# Patient Record
Sex: Male | Born: 1950 | Race: White | Hispanic: No | Marital: Married | Smoking: Never smoker
Health system: Southern US, Community
[De-identification: ages and names within clinical notes are randomized; demographics above are authoritative.]

## PROBLEM LIST (undated history)

## (undated) DIAGNOSIS — Z7289 Other problems related to lifestyle: Secondary | ICD-10-CM

## (undated) DIAGNOSIS — I480 Paroxysmal atrial fibrillation: Secondary | ICD-10-CM

## (undated) DIAGNOSIS — E785 Hyperlipidemia, unspecified: Secondary | ICD-10-CM

## (undated) DIAGNOSIS — I714 Abdominal aortic aneurysm, without rupture, unspecified: Secondary | ICD-10-CM

## (undated) DIAGNOSIS — F1090 Alcohol use, unspecified, uncomplicated: Secondary | ICD-10-CM

## (undated) DIAGNOSIS — R7303 Prediabetes: Secondary | ICD-10-CM

## (undated) DIAGNOSIS — I4510 Unspecified right bundle-branch block: Secondary | ICD-10-CM

## (undated) DIAGNOSIS — I10 Essential (primary) hypertension: Secondary | ICD-10-CM

## (undated) DIAGNOSIS — M1990 Unspecified osteoarthritis, unspecified site: Secondary | ICD-10-CM

## (undated) DIAGNOSIS — E119 Type 2 diabetes mellitus without complications: Secondary | ICD-10-CM

## (undated) HISTORY — DX: Other problems related to lifestyle: Z72.89

## (undated) HISTORY — DX: Alcohol use, unspecified, uncomplicated: F10.90

## (undated) HISTORY — PX: TONSILLECTOMY: SUR1361

## (undated) HISTORY — DX: Essential (primary) hypertension: I10

## (undated) HISTORY — DX: Prediabetes: R73.03

## (undated) HISTORY — DX: Hyperlipidemia, unspecified: E78.5

## (undated) HISTORY — DX: Unspecified right bundle-branch block: I45.10

## (undated) HISTORY — DX: Paroxysmal atrial fibrillation: I48.0

## (undated) SURGERY — Surgical Case
Anesthesia: *Unknown

---

## 2018-07-12 ENCOUNTER — Emergency Department (HOSPITAL_COMMUNITY): Payer: Medicare Other

## 2018-07-12 ENCOUNTER — Other Ambulatory Visit: Payer: Self-pay

## 2018-07-12 ENCOUNTER — Encounter (HOSPITAL_COMMUNITY): Payer: Self-pay

## 2018-07-12 ENCOUNTER — Emergency Department (HOSPITAL_COMMUNITY)
Admission: EM | Admit: 2018-07-12 | Discharge: 2018-07-12 | Disposition: A | Discharge status: Home / Self Care | Payer: Medicare Other | Attending: Emergency Medicine | Admitting: Emergency Medicine

## 2018-07-12 DIAGNOSIS — I4891 Unspecified atrial fibrillation: Secondary | ICD-10-CM | POA: Diagnosis not present

## 2018-07-12 DIAGNOSIS — Z79899 Other long term (current) drug therapy: Secondary | ICD-10-CM | POA: Diagnosis not present

## 2018-07-12 DIAGNOSIS — I10 Essential (primary) hypertension: Secondary | ICD-10-CM | POA: Diagnosis not present

## 2018-07-12 DIAGNOSIS — E119 Type 2 diabetes mellitus without complications: Secondary | ICD-10-CM | POA: Diagnosis not present

## 2018-07-12 DIAGNOSIS — Z7901 Long term (current) use of anticoagulants: Secondary | ICD-10-CM | POA: Insufficient documentation

## 2018-07-12 DIAGNOSIS — R5383 Other fatigue: Secondary | ICD-10-CM | POA: Diagnosis present

## 2018-07-12 HISTORY — DX: Abdominal aortic aneurysm, without rupture: I71.4

## 2018-07-12 HISTORY — DX: Unspecified osteoarthritis, unspecified site: M19.90

## 2018-07-12 HISTORY — DX: Type 2 diabetes mellitus without complications: E11.9

## 2018-07-12 HISTORY — DX: Abdominal aortic aneurysm, without rupture, unspecified: I71.40

## 2018-07-12 LAB — COMPREHENSIVE METABOLIC PANEL
ALT: 41 U/L (ref 0–44)
AST: 35 U/L (ref 15–41)
Albumin: 4.2 g/dL (ref 3.5–5.0)
Alkaline Phosphatase: 41 U/L (ref 38–126)
Anion gap: 13 (ref 5–15)
BUN: 12 mg/dL (ref 8–23)
CO2: 20 mmol/L — ABNORMAL LOW (ref 22–32)
CREATININE: 1 mg/dL (ref 0.61–1.24)
Calcium: 9.8 mg/dL (ref 8.9–10.3)
Chloride: 105 mmol/L (ref 98–111)
GFR calc Af Amer: >60 mL/min (ref >60)
GFR calc non Af Amer: >60 mL/min (ref >60)
Glucose, Bld: 110 mg/dL — ABNORMAL HIGH (ref 70–99)
Potassium: 4.2 mmol/L (ref 3.5–5.1)
Sodium: 138 mmol/L (ref 135–145)
Total Bilirubin: 0.9 mg/dL (ref 0.3–1.2)
Total Protein: 7.6 g/dL (ref 6.5–8.1)

## 2018-07-12 LAB — CBC WITH DIFFERENTIAL/PLATELET
Abs Immature Granulocytes: 0.02 10*3/uL (ref 0.00–0.07)
Basophils Absolute: 0 10*3/uL (ref 0.0–0.1)
Basophils Relative: 1 %
Eosinophils Absolute: 0.2 10*3/uL (ref 0.0–0.5)
Eosinophils Relative: 3 %
HCT: 45.7 % (ref 39.0–52.0)
Hemoglobin: 15.7 g/dL (ref 13.0–17.0)
Immature Granulocytes: 0 %
Lymphocytes Relative: 25 %
Lymphs Abs: 1.7 10*3/uL (ref 0.7–4.0)
MCH: 32.4 pg (ref 26.0–34.0)
MCHC: 34.4 g/dL (ref 30.0–36.0)
MCV: 94.2 fL (ref 80.0–100.0)
MONOS PCT: 9 %
Monocytes Absolute: 0.6 10*3/uL (ref 0.1–1.0)
Neutro Abs: 4.2 10*3/uL (ref 1.7–7.7)
Neutrophils Relative %: 62 %
Platelets: 177 10*3/uL (ref 150–400)
RBC: 4.85 MIL/uL (ref 4.22–5.81)
RDW: 11.9 % (ref 11.5–15.5)
WBC: 6.7 10*3/uL (ref 4.0–10.5)
nRBC: 0 % (ref 0.0–0.2)

## 2018-07-12 LAB — TROPONIN I
Troponin I: <0.03 ng/mL (ref <0.03)
Troponin I: <0.03 ng/mL (ref <0.03)

## 2018-07-12 LAB — LIPASE, BLOOD: Lipase: 50 U/L (ref 11–51)

## 2018-07-12 LAB — MAGNESIUM: Magnesium: 2.1 mg/dL (ref 1.7–2.4)

## 2018-07-12 MED ORDER — DILTIAZEM HCL 25 MG/5ML IV SOLN
10.0000 mg | Freq: Once | INTRAVENOUS | Status: AC
  Administered 2018-07-12: 10 mg via INTRAVENOUS
  Filled 2018-07-12: qty 5

## 2018-07-12 MED ORDER — DILTIAZEM HCL 30 MG PO TABS
30.0000 mg | ORAL_TABLET | Freq: Once | ORAL | Status: AC
  Administered 2018-07-12: 30 mg via ORAL
  Filled 2018-07-12 (×2): qty 1

## 2018-07-12 MED ORDER — RIVAROXABAN 20 MG PO TABS: 20.0000 mg | ORAL_TABLET | Freq: Every day | ORAL | 0 refills | Status: DC

## 2018-07-12 MED ORDER — METOPROLOL SUCCINATE ER 25 MG PO TB24: 25.0000 mg | ORAL_TABLET | Freq: Every day | ORAL | 0 refills | Status: DC

## 2018-07-12 MED ORDER — RIVAROXABAN (XARELTO) EDUCATION KIT FOR AFIB PATIENTS
PACK | Freq: Once | Status: AC
  Administered 2018-07-12: 23:00:00
  Filled 2018-07-12: qty 1

## 2018-07-12 MED ORDER — RIVAROXABAN 20 MG PO TABS
20.0000 mg | ORAL_TABLET | Freq: Every day | ORAL | Status: DC
  Administered 2018-07-12: 20 mg via ORAL
  Filled 2018-07-12: qty 1

## 2018-07-12 NOTE — Discharge Instructions (Signed)
Please do not take any ibuprofen while you are taking Xarelto.  The cardiologist office should call you tomorrow morning to set up an appointment.  Please stop taking your amlodipine.  If you develop any new pain, that pain in your back returns, or you have any additional concerns please return to the emergency room.  Please do not take any more sildenafil (Revatio) until you check with the cardiologist if it is safe.

## 2018-07-12 NOTE — ED Triage Notes (Signed)
Pt arrives in POV from home c/o fatigue and new onset of afib. Pt stated he went to urgent care today because he wasn't feeling well and was found to be in afib; pt has hx of HTN; denies pain. A&o x4. VSS at this time.

## 2018-07-12 NOTE — ED Provider Notes (Signed)
[facility] EMERGENCY DEPARTMENT Provider Note   CSN: [id number] Arrival date & time: 07/12/18  1705     History   Chief Complaint Chief Complaint  Patient presents with  . Atrial Fibrillation    HPI Luis Adams is a 68 y.o. male with a past medical history of arthritis, DM 2, hypertension, who presents today for evaluation of generally feeling weak for 6 days.  He reports that he has been feeling much worse over the past 2 days with general fatigue.  He also reports worsening pain in his back between his shoulder blades.  He usually has pain in this area and this is not a new pain just worse than usual.  No fevers.  No shortness of breath.  No nausea vomiting or diarrhea.  He initially went to an urgent care after his blood pressure was high when he checked it at the drugstore and an EKG, saw he was in A. fib and sent him here.  He does not have any history of A. fib.  Does not take any anticoagulants.  He denies any recent excess caffeine, stimulant, or Sudafed use.  HPI  Past Medical History:  Diagnosis Date  . AAA (abdominal aortic aneurysm) ([place])   . Arthritis   . Diabetes mellitus without complication ([place])   . Hypertension     There are no active problems to display for this patient.   Past Surgical History:  Procedure Laterality Date  . TONSILLECTOMY          Home Medications    Prior to Admission medications   Medication Sig Start Date End Date Taking? Authorizing Provider  ibuprofen (ADVIL,MOTRIN) 600 MG tablet Take 600 mg by mouth daily as needed (back pain).   Yes [provider]  quinapril (ACCUPRIL) 40 MG tablet Take 40 mg by mouth daily. 06/18/18  Yes [provider]  sildenafil (REVATIO) 20 MG tablet Take 100 mg by mouth daily as needed (erectile dysfunction).  04/17/18  Yes [provider]  Tetrahydrozoline HCl (VISINE OP) Apply 1 drop to eye daily as needed (irritation/ dry eyes).   Yes [provider]  metoprolol succinate (TOPROL-XL) 25 MG 24 hr tablet Take 1 tablet (25 mg total) by mouth daily for 30 days. 07/12/18 08/11/18  Lorin Glass, PA-C  rivaroxaban (XARELTO) 20 MG TABS tablet Take 1 tablet (20 mg total) by mouth daily with supper. 07/12/18   Lorin Glass, PA-C  Norvasc '5mg'$   Quinapril '40mg'$   Family History Family History  Adopted: Yes  Problem Relation Age of Onset  . Cancer Maternal Grandmother     Social History Social History   Tobacco Use  . Smoking status: Never Smoker  . Smokeless tobacco: Never Used  Substance Use Topics  . Alcohol use: Yes    Alcohol/week: 6.0 standard drinks    Types: 6 Cans of beer per week    Comment: reports drinking 1 can of beer daily  . Drug use: Never     Allergies   Patient has no known allergies.   Review of Systems Review of Systems  Constitutional: Positive for fatigue. Negative for chills and fever.  HENT: Negative for congestion.   Respiratory: Negative for chest tightness and shortness of breath.   Cardiovascular: Positive for palpitations. Negative for chest pain and leg swelling.  Gastrointestinal: Negative for abdominal pain, diarrhea, nausea and vomiting.  Musculoskeletal: Positive for back pain.  Neurological: Negative for weakness and headaches.  All other systems reviewed  and are negative.    Physical Exam Updated Vital Signs BP 138/86   Pulse 63   Temp 98.5 F (36.9 C) (Oral)   Resp 18   Ht 6' (1.829 m)   Wt 90.7 kg   SpO2 100%   BMI 27.12 kg/m   Physical Exam Vitals signs and nursing note reviewed.  Constitutional:      Appearance: He is well-developed.  HENT:     Head: Normocephalic and atraumatic.     Nose: Nose normal.  Eyes:     Conjunctiva/sclera: Conjunctivae normal.  Neck:     Musculoskeletal: Full passive range of motion without pain and neck supple. No neck rigidity.     Vascular: No JVD.     Trachea: Trachea normal.  Cardiovascular:     Rate and  Rhythm: Tachycardia present. Rhythm irregularly irregular.     Pulses: Normal pulses.          Radial pulses are 2+ on the right side and 2+ on the left side.       Popliteal pulses are 2+ on the right side and 2+ on the left side.       Dorsalis pedis pulses are 2+ on the right side and 2+ on the left side.     Heart sounds: No murmur.  Pulmonary:     Effort: Pulmonary effort is normal. No respiratory distress.     Breath sounds: Normal breath sounds and air entry. No decreased breath sounds.  Abdominal:     Palpations: Abdomen is soft.     Tenderness: There is no abdominal tenderness.  Musculoskeletal:     Right lower leg: No edema.     Left lower leg: No edema.  Skin:    General: Skin is warm and dry.  Neurological:     Mental Status: He is alert.      ED Treatments / Results  Labs (all labs ordered are listed, but only abnormal results are displayed) Labs Reviewed  COMPREHENSIVE METABOLIC PANEL - Abnormal; Notable for the following components:      Result Value   CO2 20 (*)    Glucose, Bld 110 (*)    All other components within normal limits  LIPASE, BLOOD  CBC WITH DIFFERENTIAL/PLATELET  TROPONIN I  MAGNESIUM  TROPONIN I    EKG EKG Interpretation  Date/Time:  Sunday July 12 2018 17:17:20 EST Ventricular Rate:  100 PR Interval:    QRS Duration: 140 QT Interval:  364 QTC Calculation: 470 R Axis:   72 Text Interpretation:  Atrial fibrillation Right bundle branch block No old tracing to compare Confirmed by Dorie Rank [phone]) on 07/12/2018 5:38:46 PM   EKG Interpretation  Date/Time:  Sunday July 12 2018 20:25:27 EST Ventricular Rate:  84 PR Interval:    QRS Duration: 147 QT Interval:  400 QTC Calculation: 473 R Axis:   56 Text Interpretation:  Atrial fibrillation Right bundle branch block Since last tracing rate slower Confirmed by Dorie Rank [id number]) on 07/12/2018 8:27:44 PM        Radiology Dg Chest 2 View  Result Date: 07/12/2018 CLINICAL  DATA:  Fatigue, new onset atrial fibrillation, chest pain and back pain. EXAM: CHEST - 2 VIEW COMPARISON:  None. FINDINGS: Heart size and mediastinal contours are within normal limits. Lungs are clear. No pleural effusion or pneumothorax seen. Mild degenerative spurring within the thoracic spine. No acute or suspicious osseous finding. IMPRESSION: No active cardiopulmonary disease. No evidence of pneumonia or pulmonary edema. Electronically Signed  By: Franki Cabot M.D.   On: 07/12/2018 19:09    Procedures Procedures (including critical care time)  Medications Ordered in ED Medications  rivaroxaban (XARELTO) tablet 20 mg (20 mg Oral Given 07/12/18 2138)  diltiazem (CARDIZEM) tablet 30 mg (30 mg Oral Given 07/12/18 1858)  diltiazem (CARDIZEM) injection 10 mg (10 mg Intravenous Given 07/12/18 1949)  rivaroxaban [employer]) Education Kit for Afib patients ( Does not apply Given 07/12/18 2308)     Initial Impression / Assessment and Plan / ED Course  I have reviewed the triage vital signs and the nursing notes.  Pertinent labs & imaging results that were available during my care of the patient were reviewed by me and considered in my medical decision making (see chart for details).  Clinical Course as of Jul 12 2322  Nancy Fetter Jul 12, 2018  2019 Patient's heart rate is now down into the 70s.  He reports that the pain in his neck and back has gone away, around the time when his heart rate decreased.   [EH]  2109 With Dr. Radford Pax from cardiology.  She recommends stopping patient's amlodipine and starting him on Toprol XL are 25 mg in addition to starting with Xarelto 20 mg.  She states she has appointment in her office tomorrow and will see the patient then.   [EH]    Clinical Course User Index [EH] Lorin Glass, PA-C   Cha2DS-VASc score 3.   Ikey Omary presents today for evaluation of generally feeling weak with new onset A. fib found today at urgent care.  On initial examination he was  tachycardic with A. fib into the 120s.  He had tightness in between his shoulder blades posteriorly.  His symptoms have been going on for approximately 6 days and he is not anticoagulated, therefore not a candidate for cardioversion.  Labs were obtained and reviewed, he does not have any significant hematologic or electrolyte derangements.  Magnesium is normal.  Troponin negative x2.  X-ray without acute abnormalities.  He was given p.o. 30 mg Cardizem which did not slow his rate down, he was then given 10 mg of diltiazem IV which had adequate rate control slowing him into the 80s.  When his HR slowed his pain in his back and neck went away.    I spoke with Dr. Radford Pax from cardiology who recommended having patient stop his amlodipine, starting him on metoprolol XL 25 mg and Xarelto 20 mg.  He is given Xarelto education kit in addition to first dose of Xarelto while in the department.    Dr. Radford Pax reports she will be able to see the patient tomorrow in the office.    Patient was provided xarelto education by both my self and RN.    Return precautions were discussed with patient who states their understanding.  At the time of discharge patient denied any unaddressed complaints or concerns.  Patient is agreeable for discharge home.   Final Clinical Impressions(s) / ED Diagnoses   Final diagnoses:  Atrial fibrillation with RVR ([place])  New onset atrial fibrillation [facility])    ED Discharge Orders         Ordered    Amb referral to AFIB Clinic     07/12/18 1743    rivaroxaban (XARELTO) 20 MG TABS tablet  Daily with supper     07/12/18 2252    metoprolol succinate (TOPROL-XL) 25 MG 24 hr tablet  Daily     07/12/18 2252  Ollen Gross 07/12/18 2335    Dorie Rank, MD 07/15/18 1332

## 2018-07-13 ENCOUNTER — Ambulatory Visit (INDEPENDENT_AMBULATORY_CARE_PROVIDER_SITE_OTHER): Payer: Medicare Other | Admitting: Cardiology

## 2018-07-13 ENCOUNTER — Encounter: Payer: Self-pay | Admitting: Cardiology

## 2018-07-13 VITALS — BP 141/90 | HR 71 | Ht 72.0 in | Wt 198.2 lb

## 2018-07-13 DIAGNOSIS — E119 Type 2 diabetes mellitus without complications: Secondary | ICD-10-CM | POA: Insufficient documentation

## 2018-07-13 DIAGNOSIS — I4510 Unspecified right bundle-branch block: Secondary | ICD-10-CM

## 2018-07-13 DIAGNOSIS — I10 Essential (primary) hypertension: Secondary | ICD-10-CM

## 2018-07-13 DIAGNOSIS — E1100 Type 2 diabetes mellitus with hyperosmolarity without nonketotic hyperglycemic-hyperosmolar coma (NKHHC): Secondary | ICD-10-CM

## 2018-07-13 DIAGNOSIS — I480 Paroxysmal atrial fibrillation: Secondary | ICD-10-CM

## 2018-07-13 DIAGNOSIS — I4891 Unspecified atrial fibrillation: Secondary | ICD-10-CM | POA: Diagnosis not present

## 2018-07-13 HISTORY — DX: Essential (primary) hypertension: I10

## 2018-07-13 HISTORY — DX: Paroxysmal atrial fibrillation: I48.0

## 2018-07-13 MED ORDER — RIVAROXABAN 20 MG PO TABS: 20.0000 mg | ORAL_TABLET | Freq: Every day | ORAL | 11 refills | Status: DC

## 2018-07-13 MED ORDER — RIVAROXABAN 20 MG PO TABS: 20.0000 mg | ORAL_TABLET | Freq: Every day | ORAL | 0 refills | Status: DC

## 2018-07-13 NOTE — Progress Notes (Signed)
Cardiology Office Note    Date:  07/13/2018   ID:  Luis BeginDaniel Ibrahim, DOB 20-Feb-1951, MRN [id number]  PCP:  Kathlee NationsEason, Paul, MD  Cardiologist:  Armanda Magicraci Mirissa Lopresti, MD   Chief Complaint  Patient presents with  . Atrial Fibrillation    History of Present Illness:  Luis Adams is a 68 y.o. male who is being seen today for the evaluation of Atrial fibrillation at the request of Kathlee NationsEason, Paul, MD.  This is a very pleasant 68yo male with a history of AAA, DM and HTN.  He says that over the past week he has been feeling weak and then over the past 2 days he has been very fatigued.  He also noticed pain between his shoulder blades which is a chronic thing for him but worse recently.  He denies any CP, SOB, nausea or diaphoresis.  He went to Urgent Care because he checked his BP at the drug store and it was high.  At the Urgent care he was found to be in afib and sent to the ER.  He was noted to be in afib with RVR in the 120's.  Trop was negative x 2.  He was given Cardizem 30mg  PO and then 10mg  IV with HRs down into the 80;s.  He was started on Xarelto 20mg  daily and Toprol XL 25mg  daily and is not referred for further evaluation.    This am he tells me he feels much better and thinks he is back in NSR.  He also says that the shoulder blade discomfort is actually over his Trapezius muscle and gets tight whenever he gets stressed.  He drinks 3 cups of caffeinated coffee daily and 5 beers daily.    Past Medical History:  Diagnosis Date  . AAA (abdominal aortic aneurysm) ([facility])   . Arthritis   . Benign essential HTN 07/13/2018  . Diabetes mellitus without complication ([facility])   . PAF (paroxysmal atrial fibrillation) ([facility]) 07/13/2018    Past Surgical History:  Procedure Laterality Date  . TONSILLECTOMY      Current Medications: Current Meds  Medication Sig  . ibuprofen (ADVIL,MOTRIN) 600 MG tablet Take 600 mg by mouth daily as needed (back pain).  . metoprolol succinate (TOPROL-XL) 25 MG 24 hr tablet  Take 1 tablet (25 mg total) by mouth daily for 30 days.  . quinapril (ACCUPRIL) 40 MG tablet Take 40 mg by mouth daily.  . rivaroxaban (XARELTO) 20 MG TABS tablet Take 1 tablet (20 mg total) by mouth daily with supper.  . sildenafil (REVATIO) 20 MG tablet Take 100 mg by mouth daily as needed (erectile dysfunction).   . Tetrahydrozoline HCl (VISINE OP) Apply 1 drop to eye daily as needed (irritation/ dry eyes).    Allergies:   Patient has no known allergies.   Social History   Socioeconomic History  . Marital status: Married    Spouse name: Not on file  . Number of children: Not on file  . Years of education: Not on file  . Highest education level: Not on file  Occupational History  . Not on file  Social Needs  . Financial resource strain: Not on file  . Food insecurity:    Worry: Not on file    Inability: Not on file  . Transportation needs:    Medical: Not on file    Non-medical: Not on file  Tobacco Use  . Smoking status: Never Smoker  . Smokeless tobacco: Never Used  Substance and Sexual Activity  .  Alcohol use: Yes    Alcohol/week: 6.0 standard drinks    Types: 6 Cans of beer per week    Comment: reports drinking 1 can of beer daily  . Drug use: Never  . Sexual activity: Not on file  Lifestyle  . Physical activity:    Days per week: Not on file    Minutes per session: Not on file  . Stress: Not on file  Relationships  . Social connections:    Talks on phone: Not on file    Gets together: Not on file    Attends religious service: Not on file    Active member of club or organization: Not on file    Attends meetings of clubs or organizations: Not on file    Relationship status: Not on file  Other Topics Concern  . Not on file  Social History Narrative  . Not on file     Family History:  The patient's family history includes Cancer in his maternal grandmother. He was adopted.   ROS:   Please see the history of present illness.    ROS All other systems  reviewed and are negative.  No flowsheet data found.     PHYSICAL EXAM:   VS:  BP (!) 141/90   Pulse 71   Ht 6' (1.829 m)   Wt 198 lb 3.2 oz (89.9 kg)   BMI 26.88 kg/m    GEN: Well nourished, well developed, in no acute distress  HEENT: normal  Neck: no JVD, carotid bruits, or masses Cardiac: irregularlyy irregular; no murmurs, rubs, or gallops,no edema.  Intact distal pulses bilaterally.  Respiratory:  clear to auscultation bilaterally, normal work of breathing GI: soft, nontender, nondistended, + BS MS: no deformity or atrophy  Skin: warm and dry, no rash Neuro:  Alert and Oriented x 3, Strength and sensation are intact Psych: euthymic mood, full affect  Wt Readings from Last 3 Encounters:  07/13/18 198 lb 3.2 oz (89.9 kg)  07/12/18 200 lb (90.7 kg)      Studies/Labs Reviewed:   EKG:  EKG is ordered today and showed NSR with RBBB  Recent Labs: 07/12/2018: ALT 41; BUN 12; Creatinine, Ser 1.00; Hemoglobin 15.7; Magnesium 2.1; Platelets 177; Potassium 4.2; Sodium 138   Lipid Panel No results found for: CHOL, TRIG, HDL, CHOLHDL, VLDL, LDLCALC, LDLDIRECT  Additional studies/ records that were reviewed today include:  Hospital notes and labs from 07/12/2018    ASSESSMENT:    1. New onset atrial fibrillation ([facility])   2. Benign essential HTN   3. Type 2 diabetes mellitus with hyperosmolarity without coma, without long-term current use of insulin ([facility])      PLAN:  In order of problems listed above:  1. New Onset atrial fibrillation - this has likely been going on at least a week based on symptoms.  He is now back in NSR and feels much better.   He will continue on Xarelto 20mg  daily but is concerned about affordability.  K+ 4.2 and Mag 2.1. I will check a 2D echo to assess LVF and LA size.  Continue BB.  We discussed triggers for PAF and I encouraged him to cut back on caffeine to 1 cup daily and to wean off ETOH all together.   2.  HTN - BP is borderline controlled  on exam today.  He will continue on Toprol XL 25mg  daily and Quinapril 40mg  daily.  I have encouraged him to follow a low sodium diet.   3.  Type 2 DM - this is followed by his PCP.    4.  RBBB/Shoulder blade pain - this is a chronic problem that increases when he is stressed and feels like his trapezius muscle is in spasm.  No acute ST changes on EKG and trop neg x 2.  This does not sound cardiac in nature but he has an abnormal EKG with RBBB.  Given CRFs including tobacco use in the past, HTN and pre DM will get a Lexiscan myoview to rule out ischemia.     Medication Adjustments/Labs and Tests Ordered: Current medicines are reviewed at length with the patient today.  Concerns regarding medicines are outlined above.  Medication changes, Labs and Tests ordered today are listed in the Patient Instructions below.  There are no Patient Instructions on file for this visit.   Signed, Armanda Magic, MD  07/13/2018 11:32 AM    [facility] [address] [place], [place], [place]  [address] Phone: [phone]; Fax: [phone]

## 2018-07-13 NOTE — Addendum Note (Signed)
Addended by: Dustin Flock on: 07/13/2018 01:05 PM   Modules accepted: Orders

## 2018-07-13 NOTE — Patient Instructions (Signed)
Medication Instructions:  Stop: Ibuprofen  If you need a refill on your cardiac medications before your next appointment, please call your pharmacy.   Lab work: None If you have labs (blood work) drawn today and your tests are completely normal, you will receive your results only by: Marland Kitchen MyChart Message (if you have MyChart) OR . A paper copy in the mail If you have any lab test that is abnormal or we need to change your treatment, we will call you to review the results.  Testing/Procedures: Your physician has requested that you have an echocardiogram. Echocardiography is a painless test that uses sound waves to create images of your heart. It provides your doctor with information about the size and shape of your heart and how well your heart's chambers and valves are working. This procedure takes approximately one hour. There are no restrictions for this procedure.  Your physician has requested that you have a lexiscan myoview. For further information please visit [email or web address] Please follow instruction sheet, as given.  Follow-Up: At [facility], you and your health needs are our priority.  As part of our continuing mission to provide you with exceptional heart care, we have created designated Provider Care Teams.  These Care Teams include your primary Cardiologist (physician) and Advanced Practice Providers (APPs -  Physician Assistants and Nurse Practitioners) who all work together to provide you with the care you need, when you need it. You will need a follow up appointment in 6 months.  Please call our office 2 months in advance to schedule this appointment.  You may see Dr. Mayford Knife or one of the following Advanced Practice Providers on your designated Care Team:   Edmund, PA-C Ronie Spies, PA-C . Jacolyn Reedy, PA-C

## 2018-07-20 ENCOUNTER — Telehealth (HOSPITAL_COMMUNITY): Payer: Self-pay | Admitting: *Deleted

## 2018-07-20 NOTE — Telephone Encounter (Signed)
Patient given detailed instructions per Myocardial Perfusion Study Information Sheet for the test on 07/22/18. Patient notified to arrive 15 minutes early and that it is imperative to arrive on time for appointment to keep from having the test rescheduled.  If you need to cancel or reschedule your appointment, please call the office within 24 hours of your appointment. . Patient verbalized understanding. Luis Adams Jacqueline    

## 2018-07-22 ENCOUNTER — Ambulatory Visit (HOSPITAL_COMMUNITY): Payer: Medicare Other | Attending: Internal Medicine

## 2018-07-22 ENCOUNTER — Ambulatory Visit (HOSPITAL_BASED_OUTPATIENT_CLINIC_OR_DEPARTMENT_OTHER): Payer: Medicare Other

## 2018-07-22 DIAGNOSIS — I4891 Unspecified atrial fibrillation: Secondary | ICD-10-CM | POA: Insufficient documentation

## 2018-07-22 DIAGNOSIS — I4510 Unspecified right bundle-branch block: Secondary | ICD-10-CM | POA: Insufficient documentation

## 2018-07-22 LAB — MYOCARDIAL PERFUSION IMAGING
CHL CUP NUCLEAR SDS: 0
CHL CUP NUCLEAR SSS: 0
LV dias vol: 93 mL (ref 62–150)
LV sys vol: 44 mL
Peak HR: 99 {beats}/min
Rest HR: 49 {beats}/min
SRS: 0
TID: 1.09

## 2018-07-22 LAB — ECHOCARDIOGRAM COMPLETE
Height: 72 in
Weight: 3168 oz

## 2018-07-22 MED ORDER — TECHNETIUM TC 99M TETROFOSMIN IV KIT
32.8000 | PACK | Freq: Once | INTRAVENOUS | Status: AC | PRN
  Administered 2018-07-22: 32.8 via INTRAVENOUS
  Filled 2018-07-22: qty 33

## 2018-07-22 MED ORDER — PERFLUTREN LIPID MICROSPHERE
1.0000 mL | INTRAVENOUS | Status: AC | PRN
  Administered 2018-07-22: 2 mL via INTRAVENOUS

## 2018-07-22 MED ORDER — REGADENOSON 0.4 MG/5ML IV SOLN
0.4000 mg | Freq: Once | INTRAVENOUS | Status: AC
  Administered 2018-07-22: 0.4 mg via INTRAVENOUS

## 2018-07-22 MED ORDER — TECHNETIUM TC 99M TETROFOSMIN IV KIT
10.5000 | PACK | Freq: Once | INTRAVENOUS | Status: AC | PRN
  Administered 2018-07-22: 10.5 via INTRAVENOUS
  Filled 2018-07-22: qty 11

## 2018-07-23 ENCOUNTER — Encounter: Payer: Self-pay | Admitting: Cardiology

## 2018-07-23 NOTE — Telephone Encounter (Signed)
Follow Up:    Returning Pat's call, concerning his Stress Test results.

## 2018-07-23 NOTE — Telephone Encounter (Signed)
See result note.  

## 2018-08-07 ENCOUNTER — Telehealth: Payer: Self-pay

## 2018-08-07 NOTE — Telephone Encounter (Signed)
Patient Luis Adams) would like to receive a call back either from Dr. Mayford Knife or her nurse in regards to Metoprolol succinate 25mg . Patient states he will run out of this medication in the next few days. The pharmacy wouldn't give him another refill without Dr.Turner authorization. He wants to know if he should continue taking this or if he should stop. His phone number is [phone].

## 2018-08-07 NOTE — Telephone Encounter (Signed)
Spoke with the patient, since taking his medication he has been really dizzy. He has not taken any blood pressures, I advised the patient, to check his blood pressure over the weekend and give [place] the results. Sending to Dr. Mayford Knife for recommendations.

## 2018-08-09 NOTE — Telephone Encounter (Signed)
His BP was actually on the higher side of normal at last OV.  I would prefer that he check BPs daily for a week and call with results.  Also have him check BP when he gets dizzy.

## 2018-08-11 NOTE — Telephone Encounter (Signed)
Patient is calling today to check on status of what he should do.  He states he is out of medication at this point, he wants to know if he should continue taking it or not. He also would like to take to the nurse about it.

## 2018-08-12 ENCOUNTER — Encounter: Payer: Self-pay | Admitting: Cardiology

## 2018-08-12 MED ORDER — DILTIAZEM HCL ER COATED BEADS 120 MG PO CP24: 120.0000 mg | ORAL_CAPSULE | Freq: Every day | ORAL | 11 refills | Status: DC

## 2018-08-12 NOTE — Telephone Encounter (Signed)
New Message   Pt c/o medication issue:  1. Name of Medication: Metoprolol   2. How are you currently taking this medication (dosage and times per day)? 25mg    3. Are you having a reaction (difficulty breathing--STAT)? NO  4. What is your medication issue? Patient wants to know if he should continue to take the medication or not because he is out of the medication.

## 2018-08-12 NOTE — Telephone Encounter (Signed)
Spoke with the patient, his blood pressure and heart rates are, Friday: 131/78- 69 HR Saturday: 143/80-67 HR Sunday: 139/81-60 HR Monday: 135/77-57 HR  The patient stated that since being on metoprolol he has felt very dizzy in the morning and evening. He also has been noticing cramping in his hands and legs. He believes his gout ins flaring up due to the medication. He is seeing PCP today to discuss his gout.

## 2018-08-12 NOTE — Telephone Encounter (Signed)
Change Lopressor to cardizem CD 120mg  daily.  Check BP and HR daily for a week and call with results

## 2018-08-12 NOTE — Telephone Encounter (Signed)
Spoke with the patient, he expressed understanding and he will start Cardizem cd tomorrow. He had no further questions.

## 2018-08-12 NOTE — Telephone Encounter (Signed)
See other encounter.

## 2018-08-21 ENCOUNTER — Telehealth: Payer: Self-pay | Admitting: Cardiology

## 2018-08-21 NOTE — Telephone Encounter (Signed)
The patient expressed understanding. He had no further questions.

## 2018-08-21 NOTE — Telephone Encounter (Signed)
Left message to call back  

## 2018-08-21 NOTE — Telephone Encounter (Signed)
  Pt c/o BP issue: STAT if pt c/o blurred vision, one-sided weakness or slurred speech  1. What are your last 5 BP readings? 128/74, 118/69, 121/69, 138/77, 132/73, 132/68, 146/82, 130/82, 145/82  2. Are you having any other symptoms (ex. Dizziness, headache, blurred vision, passed out)? no  3. What is your BP issue? Patient was asked to call and give the nurse his BP readings over the last week. He would like a call back to let him know if he is good or if medication needs to be changed again.

## 2018-08-21 NOTE — Telephone Encounter (Signed)
BPs look good.  No change in meds

## 2018-08-21 NOTE — Telephone Encounter (Signed)
Please call back soon as possible. He has to leave to pick up his wife soon

## 2018-10-02 ENCOUNTER — Telehealth: Payer: Self-pay | Admitting: Cardiology

## 2018-10-02 MED ORDER — RIVAROXABAN 20 MG PO TABS: 20.0000 mg | ORAL_TABLET | Freq: Every day | ORAL | 1 refills | Status: DC

## 2018-10-02 NOTE — Telephone Encounter (Signed)
Last OV 07/13/18 Scr 1 on 07/12/2018 crcl 25ml/min xarelto 20mg 

## 2018-10-02 NOTE — Telephone Encounter (Signed)
New Message     *STAT* If patient is at the pharmacy, call can be transferred to refill team.   1. Which medications need to be refilled? (please list name of each medication and dose if known) Xarelto 20 mg   2. Which pharmacy/location (including street and city if local pharmacy) is medication to be sent to? [facility] in [place] [phone]  3. Do they need a 30 day or 90 day supply? 90 day

## 2018-12-03 ENCOUNTER — Encounter: Payer: Self-pay | Admitting: Cardiology

## 2018-12-03 ENCOUNTER — Telehealth: Payer: Self-pay | Admitting: Cardiology

## 2018-12-03 NOTE — Telephone Encounter (Signed)
Called transferred to me from [facility] in [place].. [phone]  Pt is there with BP 180/104 HR100 with chest discomfort and SOB.. he has had all of his meds this morning.  They did an EKG which she could only tell me RBBB... she says the MD asked her to call and ask [place] what to do with the pt...   I advised her that since he is being assessed there and with them presently they should determine if the pt is stable enough to leave their facility either through private care (his wife is with him) or through EMS to be seen in the ER. She says they will probably send the pt to the local ER.   Will forward to DR. Turner/ Ben for review.

## 2018-12-03 NOTE — Telephone Encounter (Signed)
Spoke with the wife, they are headed to the hospital currently.

## 2018-12-03 NOTE — Telephone Encounter (Signed)
Instructed Urgent care to send patient to ER via EMS

## 2018-12-04 ENCOUNTER — Encounter (INDEPENDENT_AMBULATORY_CARE_PROVIDER_SITE_OTHER): Payer: Self-pay

## 2018-12-04 ENCOUNTER — Other Ambulatory Visit: Payer: Self-pay

## 2018-12-04 ENCOUNTER — Encounter: Payer: Self-pay | Admitting: Physician Assistant

## 2018-12-04 ENCOUNTER — Ambulatory Visit (INDEPENDENT_AMBULATORY_CARE_PROVIDER_SITE_OTHER): Payer: Medicare Other | Admitting: Physician Assistant

## 2018-12-04 ENCOUNTER — Telehealth: Payer: Self-pay | Admitting: *Deleted

## 2018-12-04 VITALS — BP 160/90 | HR 94 | Ht 72.0 in | Wt 198.0 lb

## 2018-12-04 DIAGNOSIS — F1090 Alcohol use, unspecified, uncomplicated: Secondary | ICD-10-CM

## 2018-12-04 DIAGNOSIS — R0789 Other chest pain: Secondary | ICD-10-CM

## 2018-12-04 DIAGNOSIS — I480 Paroxysmal atrial fibrillation: Secondary | ICD-10-CM

## 2018-12-04 DIAGNOSIS — I10 Essential (primary) hypertension: Secondary | ICD-10-CM

## 2018-12-04 DIAGNOSIS — Z7289 Other problems related to lifestyle: Secondary | ICD-10-CM | POA: Diagnosis not present

## 2018-12-04 DIAGNOSIS — I714 Abdominal aortic aneurysm, without rupture, unspecified: Secondary | ICD-10-CM

## 2018-12-04 MED ORDER — CARVEDILOL 6.25 MG PO TABS: 6.2500 mg | ORAL_TABLET | Freq: Two times a day (BID) | ORAL | 3 refills | Status: DC

## 2018-12-04 NOTE — Progress Notes (Signed)
Cardiology Office Note    Date:  12/04/2018  ID:  Luis Adams, DOB [birth year], MRN [id number] PCP:  Eber Hong, MD  Cardiologist:  Fransico Him, MD   Chief Complaint: f/u ER visit for high BP  History of Present Illness:  Luis Adams is a 68 y.o. male with history of paroxysmal atrial fibrillation, AAA (details unclear, followed by PCP), essential HTN, DM, arthritis, RBBB, habitual alcohol use who presents for f/u of BP. He established care in 06/2018 with Dr. Radford Pax for new diagnosis of atrial fib. He had noted his BP to be high so he went to urgent care and was found to be in atrial fib RVR. Troponin was negative. He was started on Toprol and Xarelto and was back in NSR at [facility] 07/13/18. 2d echo 07/22/18 showed EF 55-60%, mildly enlarged RV with normal function, RVSP 82mmHg, mild aortic sclerosis. Nuc 07/22/18 was normal, EF 53%. Last labs 06/2018 showed K 4.2, Cr 1.00, glucose 110, LFTs wnl, Mg 2.1, normal CBC. He developed some dizziness with metoprolol (with normal HR and BP per report) so was switched to diltiazem.  He helped a friend earlier this week lift something heavy and felt a pulled muscle in his left upper chest shortly after. He has not had any exertional type chest pain. For the past 3 days he has noticed his BP very elevated. He admits to eating more salt than usual. He called his PCP's office with readings (180s/100s) and was advised to go to urgent care who then sent him the ER for abnormal EKG (RBBB). He was feeling more SOB with exertion. His workup there was unremarkable. Albumin 4.7, LFTs wnl, calcium 10.6, BUN 15, Cr 1.00, Hgb 15.7, Hct 45.9, WBC 8.3, Na 139, K 4.7, plt 181, troponin negative x2, CXR NAD, EKG NSR 67bpm, RBBB, LPFB without acute change from prior.Marland Kitchen He was discharged from the ED. His PCP recommended he start amlodipine but he wanted to see our office for advice before starting.  Today he is feeling much better and has been able to ambulate without symptoms. He  denies any further chest discomfort/SOB. He truly feels the chest discomfort was a pulled, sore muscle. He does not usually get SOB but felt this while he was anxious about his BP. His BP remains high. He cut down ETOH to 1-2 drinks every few days. He does snore. We discussed impact of ETOH, sodium on BP.   Past Medical History:  Diagnosis Date  . AAA (abdominal aortic aneurysm) ([place])   . Arthritis   . Benign essential HTN 07/13/2018  . Diabetes mellitus without complication ([place])   . Habitual alcohol use   . PAF (paroxysmal atrial fibrillation) ([place]) 07/13/2018  . RBBB     Past Surgical History:  Procedure Laterality Date  . TONSILLECTOMY      Current Medications: Current Meds  Medication Sig  . diltiazem (CARDIZEM CD) 120 MG 24 hr capsule Take 1 capsule (120 mg total) by mouth daily.  . quinapril (ACCUPRIL) 40 MG tablet Take 40 mg by mouth daily.  . rivaroxaban (XARELTO) 20 MG TABS tablet Take 1 tablet (20 mg total) by mouth daily with supper.  . sildenafil (REVATIO) 20 MG tablet Take 100 mg by mouth daily as needed (erectile dysfunction).   . Tetrahydrozoline HCl (VISINE OP) Apply 1 drop to eye daily as needed (irritation/ dry eyes).     Allergies:   Patient has no known allergies.   Social History   Socioeconomic History  .  Marital status: Married    Spouse name: Not on file  . Number of children: Not on file  . Years of education: Not on file  . Highest education level: Not on file  Occupational History  . Not on file  Social Needs  . Financial resource strain: Not on file  . Food insecurity    Worry: Not on file    Inability: Not on file  . Transportation needs    Medical: Not on file    Non-medical: Not on file  Tobacco Use  . Smoking status: Never Smoker  . Smokeless tobacco: Never Used  Substance and Sexual Activity  . Alcohol use: Yes    Alcohol/week: 6.0 standard drinks    Types: 6 Cans of beer per week    Comment: reports drinking 1 can of beer daily   . Drug use: Never  . Sexual activity: Not on file  Lifestyle  . Physical activity    Days per week: Not on file    Minutes per session: Not on file  . Stress: Not on file  Relationships  . Social Musicianconnections    Talks on phone: Not on file    Gets together: Not on file    Attends religious service: Not on file    Active member of club or organization: Not on file    Attends meetings of clubs or organizations: Not on file    Relationship status: Not on file  Other Topics Concern  . Not on file  Social History Narrative  . Not on file     Family History:  The patient's family history includes Cancer in his maternal grandmother. He was adopted.  ROS:   Please see the history of present illness.  All other systems are reviewed and otherwise negative.    PHYSICAL EXAM:   VS:  BP (!) 160/90   Pulse 94   Ht 6' (1.829 m)   Wt 198 lb (89.8 kg)   SpO2 96%   BMI 26.85 kg/m   BMI: Body mass index is 26.85 kg/m. GEN: Well nourished, well developed WM, in no acute distress HEENT: normocephalic, atraumatic Neck: no JVD, carotid bruits, or masses Cardiac: RRR; no murmurs, rubs, or gallops, no edema  Respiratory:  clear to auscultation bilaterally, normal work of breathing GI: soft, nontender, nondistended, + BS MS: no deformity or atrophy Skin: warm and dry, no rash Neuro:  Alert and Oriented x 3, Strength and sensation are intact, follows commands Psych: euthymic mood, full affect  Wt Readings from Last 3 Encounters:  12/04/18 198 lb (89.8 kg)  07/22/18 198 lb (89.8 kg)  07/13/18 198 lb 3.2 oz (89.9 kg)      Studies/Labs Reviewed:   EKG:   EKG was not ordered today  Recent Labs: 07/12/2018: ALT 41; BUN 12; Creatinine, Ser 1.00; Hemoglobin 15.7; Magnesium 2.1; Platelets 177; Potassium 4.2; Sodium 138   Lipid Panel No results found for: CHOL, TRIG, HDL, CHOLHDL, VLDL, LDLCALC, LDLDIRECT  Additional studies/ records that were reviewed today include: Summarized above    ASSESSMENT & PLAN:   1. Chest discomfort/SOB in context of poorly controlled HTN - recent nuclear stress test was normal. Chest discomfort occurred as a soreness in context of heavy lifting, now resolved, no anginal-type pain. His SOB occurred while anxious about his BP. This too has resolved. He is feeling much better and able to exert himself without symptoms. Will attempt to improve BP control and see how he feels. If symptoms recur  despite optimal BP control, will need to consider cardiac CT. Adding amlodipine is not the best option while on diltiazem since they are in the same class. We discussed options of adding a diuretic vs swapping diltiazem for carvedilol. He admits that he had both dizziness on metoprolol and diltiazem but once he got acclimated to diltiazem he's done fine. Will change to carvedilol 6.25mg  BID. Offered f/u in our office but he lives quite far away in [place] and PCP is closer. He will f/u PCP in 1 week then. If BP is still elevated but tolerating carvedilol well, can either consider titrating the carvedilol or adding the amlodipine since he won't be on diltiazem any longer. I asked him to send in BP/HR readings in a few days for our review and let us know how he is feeling. Of note, given his uncontrolled HTN, snoring, mildly enlarged RV by echo, and atrial fib, he would benefit from sleep study to exclude OSA as a contributing cause. Will order. 2. Paroxysmal atrial fib - maintaining NSR by EKG yesterday and exam today. Continue Xarelto. Follow with med changes. 3. AAA - followed by PCP. He reports this was just checked 2 months ago and was stable in fact improved. Hopefully he tolerates BB as they are indicated for control of AAA as well. Goal BP <130/80. 4. Hypercalcemia - recheck BMET today. 5. Habitual alcohol use - congratulated him on cutting down but also encouraged reducing as much as possible given AFib and HTN.  Disposition: F/u with PCP in 1 week and Dr. Mayford Knifeurner  in office in 3 months (sooner if needed).  Medication Adjustments/Labs and Tests Ordered: Current medicines are reviewed at length with the patient today.  Concerns regarding medicines are outlined above. Medication changes, Labs and Tests ordered today are summarized above and listed in the Patient Instructions accessible in Encounters.   Signed, Luis Montanaayna N Dunn, PA-C  12/04/2018 2:02 PM    [facility] [address], [place], [place]  [address] Phone: [phone]; Fax: [phone]

## 2018-12-04 NOTE — Patient Instructions (Addendum)
Medication Instructions:  Your physician has recommended you make the following change in your medication:  1.  STOP the Diltiazem 2.  START Carvedilol 6.25 mg taking twice a day 3.  DO NOT PICK UP THE AMLODIPINE  If you need a refill on your cardiac medications before your next appointment, please call your pharmacy.   Lab work: TODAY:  BMET & TSH If you have labs (blood work) drawn today and your tests are completely normal, you will receive your results only by: Marland Kitchen MyChart Message (if you have MyChart) OR . A paper copy in the mail If you have any lab test that is abnormal or we need to change your treatment, we will call you to review the results.  Testing/Procedures: Your physician has recommended that you have a sleep study. Someone will contact you to set this up.  This test records several body functions during sleep, including: brain activity, eye movement, oxygen and carbon dioxide blood levels, heart rate and rhythm, breathing rate and rhythm, the flow of air through your mouth and nose, snoring, body muscle movements, and chest and belly movement.    Follow-Up: At [facility], you and your health needs are our priority.  As part of our continuing mission to provide you with exceptional heart care, we have created designated Provider Care Teams.  These Care Teams include your primary Cardiologist (physician) and Advanced Practice Providers (APPs -  Physician Assistants and Nurse Practitioners) who all work together to provide you with the care you need, when you need it. You will need a follow up appointment in 3 months with Dr. Radford Pax, 03/09/2019 at 1:00.  Please arrive 15 mins early for this appointment.  Your physician recommends that you schedule a follow-up appointment in: With your primary care physician in 1 week.    Any Other Special Instructions Will Be Listed Below (If Applicable). To check your blood pressure, choose a time about 3 hours after taking your blood  pressure medicines. Remain seated in a chair for 5 minutes quietly beforehand, then check it. When you get a cuff, please get those readings and call us/send in MyChart message with them for our review. You may find Mychart easier to use than calling in our office. This is an effective way to communicate quickly with your provider.

## 2018-12-04 NOTE — Telephone Encounter (Signed)
-----   Message from Jennifer Witty, RMA sent at 12/04/2018  2:11 PM EDT ----- Regarding: Sleep Study Pt needs a sleep study  

## 2018-12-05 LAB — BASIC METABOLIC PANEL
BUN/Creatinine Ratio: 14 (ref 10–24)
BUN: 17 mg/dL (ref 8–27)
CO2: 17 mmol/L — ABNORMAL LOW (ref 20–29)
Calcium: 10.1 mg/dL (ref 8.6–10.2)
Chloride: 100 mmol/L (ref 96–106)
Creatinine, Ser: 1.18 mg/dL (ref 0.76–1.27)
GFR calc Af Amer: 74 mL/min/{1.73_m2} (ref >59)
GFR calc non Af Amer: 64 mL/min/{1.73_m2} (ref >59)
Glucose: 87 mg/dL (ref 65–99)
Potassium: 4.4 mmol/L (ref 3.5–5.2)
Sodium: 137 mmol/L (ref 134–144)

## 2018-12-05 LAB — TSH: TSH: 0.845 u[IU]/mL (ref 0.450–4.500)

## 2018-12-07 ENCOUNTER — Telehealth: Payer: Self-pay | Admitting: *Deleted

## 2018-12-07 NOTE — Progress Notes (Signed)
Pt has been made aware of normal result and verbalized understanding.  jw 12/07/2018

## 2018-12-07 NOTE — Telephone Encounter (Signed)
Staff message sent to Luis Adams ok to schedule sleep study. Patient has Medicare/Medicaid and does not require getting a PA.

## 2018-12-07 NOTE — Telephone Encounter (Signed)
-----   Message from Jeanann Lewandowsky, [place] sent at 12/04/2018  2:11 PM EDT ----- Regarding: Sleep Study Pt needs a sleep study

## 2018-12-11 ENCOUNTER — Telehealth: Payer: Self-pay | Admitting: *Deleted

## 2018-12-11 NOTE — Telephone Encounter (Signed)
-----   Message from Wanda M Waddell, CMA sent at 12/07/2018  6:12 PM EDT ----- Regarding: RE: Sleep Study Ok to schedule sleep study. Patient has medicare and does not require Pre Cert. ----- Message ----- From: Witty, Jennifer, RMA Sent: 12/04/2018   2:11 PM EDT To: Aylyn Wenzler G Canisha Issac, CMA, Cv Div Sleep Studies Subject: Sleep Study                                    Pt needs a sleep study   

## 2018-12-11 NOTE — Telephone Encounter (Signed)
Patient cancelled until a later date. He did not specify when.

## 2018-12-11 NOTE — Telephone Encounter (Signed)
-----   Message from Lauralee Evener, [place] sent at 12/07/2018  6:12 PM EDT ----- Regarding: RE: Sleep Study Ok to schedule sleep study. Patient has medicare and does not require Pre Cert. ----- Message ----- From: Jeanann Lewandowsky, RMA Sent: 12/04/2018   2:11 PM EDT To: Freada Bergeron, CMA, Cv Div Sleep Studies Subject: Sleep Study                                    Pt needs a sleep study

## 2018-12-12 ENCOUNTER — Other Ambulatory Visit (HOSPITAL_COMMUNITY): Payer: Medicare Other

## 2018-12-15 ENCOUNTER — Encounter (HOSPITAL_BASED_OUTPATIENT_CLINIC_OR_DEPARTMENT_OTHER): Payer: Medicare Other

## 2018-12-21 ENCOUNTER — Telehealth: Payer: Self-pay | Admitting: Cardiology

## 2018-12-21 NOTE — Telephone Encounter (Signed)
Patient called about his medication rivaroxaban (XARELTO) 20 MG TABS tablet, he said he was going to receive help from [place] with the copay.  He said he was paying $31, but today when he went to pick up they told him it was going to be $210. He can't afford it.  He goes to [place], [place] - [address]

## 2018-12-22 NOTE — Telephone Encounter (Signed)
No Ans. I left a detailed message on the pts VM asking him to contact his insurance company to verify that he is in the donut hole. If he is in the donut hole I advised him to contact [employer] pt asst (I did leave their phone number on the VM) and to ask any questions he may have about applying and to request that they mail him an application. Once he completes his application and obtains needed documents I advised that he either mail all to me or drop off at the office so I can take care of the provider part and fax all into [employer] ot asst.

## 2019-01-21 ENCOUNTER — Ambulatory Visit: Payer: Medicare Other | Admitting: Cardiology

## 2019-03-09 ENCOUNTER — Encounter: Payer: Self-pay | Admitting: Cardiology

## 2019-03-09 ENCOUNTER — Ambulatory Visit (INDEPENDENT_AMBULATORY_CARE_PROVIDER_SITE_OTHER): Payer: Medicare Other | Admitting: Cardiology

## 2019-03-09 ENCOUNTER — Encounter (INDEPENDENT_AMBULATORY_CARE_PROVIDER_SITE_OTHER): Payer: Self-pay

## 2019-03-09 ENCOUNTER — Other Ambulatory Visit: Payer: Self-pay

## 2019-03-09 VITALS — BP 154/90 | HR 64 | Ht 72.0 in | Wt 203.0 lb

## 2019-03-09 DIAGNOSIS — I480 Paroxysmal atrial fibrillation: Secondary | ICD-10-CM

## 2019-03-09 DIAGNOSIS — I10 Essential (primary) hypertension: Secondary | ICD-10-CM

## 2019-03-09 DIAGNOSIS — E1100 Type 2 diabetes mellitus with hyperosmolarity without nonketotic hyperglycemic-hyperosmolar coma (NKHHC): Secondary | ICD-10-CM | POA: Diagnosis not present

## 2019-03-09 DIAGNOSIS — I714 Abdominal aortic aneurysm, without rupture, unspecified: Secondary | ICD-10-CM

## 2019-03-09 MED ORDER — QUINAPRIL HCL 40 MG PO TABS: 40.0000 mg | ORAL_TABLET | Freq: Every day | ORAL | 3 refills | Status: DC

## 2019-03-09 MED ORDER — RIVAROXABAN 20 MG PO TABS: 20.0000 mg | ORAL_TABLET | Freq: Every day | ORAL | 1 refills | Status: DC

## 2019-03-09 NOTE — Patient Instructions (Addendum)
Medication Instructions:  No changes If you need a refill on your cardiac medications before your next appointment, please call your pharmacy.   Lab work: none If you have labs (blood work) drawn today and your tests are completely normal, you will receive your results only by: Marland Kitchen MyChart Message (if you have MyChart) OR . A paper copy in the mail If you have any lab test that is abnormal or we need to change your treatment, we will call you to review the results.  Testing/Procedures: none  Follow-Up: At [facility], you and your health needs are our priority.  As part of our continuing mission to provide you with exceptional heart care, we have created designated Provider Care Teams.  These Care Teams include your primary Cardiologist (physician) and Advanced Practice Providers (APPs -  Physician Assistants and Nurse Practitioners) who all work together to provide you with the care you need, when you need it. You will need a follow up virtual appointment in 6 months with Luis Copa, PA-C and 12 months with Luis Adams in the office.  Please call our office 2 months in advance to schedule this appointment.    Any Other Special Instructions Will Be Listed Below (If Applicable). Contacted PCP office to request most recent labs be faxed to Luis Adams at [phone].

## 2019-03-09 NOTE — Progress Notes (Signed)
Cardiology Office Note:    Date:  03/09/2019   ID:  Nachmen Mansel, DOB 05-Nov-1950, MRN [id number]  PCP:  Kathlee Nations, MD  Cardiologist:  Armanda Magic, MD    Referring MD: Kathlee Nations, MD   Chief Complaint  Patient presents with  . Atrial Fibrillation  . Hypertension    History of Present Illness:    Luis Adams is a 68 y.o. male with a hx of paroxysmal atrial fibrillation, AAA (details unclear, followed by PCP), essential HTN, DM,RBBB, habitual alcohol.  2d echo 07/22/18 showed EF 55-60%, mildly enlarged RV.  Nuc 07/22/18 was normal, EF 53%. He developed some dizziness with metoprolol (with normal HR and BP per report) so was switched to diltiazem.  He is here today for followup and is doing well.  He denies any chest pain or pressure, SOB, DOE, PND, orthopnea, LE edema, dizziness, palpitations or syncope. He is compliant with his meds and is tolerating meds with no SE.    Past Medical History:  Diagnosis Date  . AAA (abdominal aortic aneurysm) ([facility])   . Arthritis   . Benign essential HTN 07/13/2018  . Diabetes mellitus without complication ([facility])   . Habitual alcohol use   . PAF (paroxysmal atrial fibrillation) ([facility]) 07/13/2018  . RBBB     Past Surgical History:  Procedure Laterality Date  . TONSILLECTOMY      Current Medications: Current Meds  Medication Sig  . carvedilol (COREG) 6.25 MG tablet Take 1 tablet (6.25 mg total) by mouth 2 (two) times daily.  . quinapril (ACCUPRIL) 40 MG tablet Take 1 tablet (40 mg total) by mouth daily.  . rivaroxaban (XARELTO) 20 MG TABS tablet Take 1 tablet (20 mg total) by mouth daily with supper.  . sildenafil (REVATIO) 20 MG tablet Take 100 mg by mouth daily as needed (erectile dysfunction).   . Tetrahydrozoline HCl (VISINE OP) Apply 1 drop to eye daily as needed (irritation/ dry eyes).  . [DISCONTINUED] quinapril (ACCUPRIL) 40 MG tablet Take 40 mg by mouth daily.  . [DISCONTINUED] rivaroxaban (XARELTO) 20 MG TABS tablet Take 1  tablet (20 mg total) by mouth daily with supper.     Allergies:   Patient has no known allergies.   Social History   Socioeconomic History  . Marital status: Married    Spouse name: Not on file  . Number of children: Not on file  . Years of education: Not on file  . Highest education level: Not on file  Occupational History  . Not on file  Social Needs  . Financial resource strain: Not on file  . Food insecurity    Worry: Not on file    Inability: Not on file  . Transportation needs    Medical: Not on file    Non-medical: Not on file  Tobacco Use  . Smoking status: Never Smoker  . Smokeless tobacco: Never Used  Substance and Sexual Activity  . Alcohol use: Yes    Alcohol/week: 6.0 standard drinks    Types: 6 Cans of beer per week    Comment: reports drinking 1 can of beer daily  . Drug use: Never  . Sexual activity: Not on file  Lifestyle  . Physical activity    Days per week: Not on file    Minutes per session: Not on file  . Stress: Not on file  Relationships  . Social Musician on phone: Not on file    Gets together: Not on file  Attends religious service: Not on file    Active member of club or organization: Not on file    Attends meetings of clubs or organizations: Not on file    Relationship status: Not on file  Other Topics Concern  . Not on file  Social History Narrative  . Not on file     Family History: The patient's family history includes Cancer in his maternal grandmother. He was adopted.  ROS:   Please see the history of present illness.    ROS  All other systems reviewed and negative.   EKGs/Labs/Other Studies Reviewed:    The following studies were reviewed today: none  EKG:  EKG is not ordered today.   Recent Labs: 07/12/2018: ALT 41; Hemoglobin 15.7; Magnesium 2.1; Platelets 177 12/04/2018: BUN 17; Creatinine, Ser 1.18; Potassium 4.4; Sodium 137; TSH 0.845   Recent Lipid Panel No results found for: CHOL, TRIG, HDL,  CHOLHDL, VLDL, LDLCALC, LDLDIRECT  Physical Exam:    VS:  BP (!) 154/90   Pulse 64   Ht 6' (1.829 m)   Wt 203 lb (92.1 kg)   SpO2 97%   BMI 27.53 kg/m     Wt Readings from Last 3 Encounters:  03/09/19 203 lb (92.1 kg)  12/04/18 198 lb (89.8 kg)  07/22/18 198 lb (89.8 kg)     GEN:  Well nourished, well developed in no acute distress HEENT: Normal NECK: No JVD; No carotid bruits LYMPHATICS: No lymphadenopathy CARDIAC: RRR, no murmurs, rubs, gallops RESPIRATORY:  Clear to auscultation without rales, wheezing or rhonchi  ABDOMEN: Soft, non-tender, non-distended MUSCULOSKELETAL:  No edema; No deformity  SKIN: Warm and dry NEUROLOGIC:  Alert and oriented x 3 PSYCHIATRIC:  Normal affect   ASSESSMENT:    1. PAF (paroxysmal atrial fibrillation) ([place])   2. Essential hypertension   3. AAA (abdominal aortic aneurysm) without rupture ([place])   4. Type 2 diabetes mellitus with hyperosmolarity without coma, without long-term current use of insulin ([facility])    PLAN:    In order of problems listed above:  1.  PAF -he has not had any palpitations -maintaining NSR on exam -continue Carvedilol and Xarelto 20mg  daily -creatinine stable at 1.18 in July and Hbg 15.7 in Feb 2020  2.  HTN -BP controlled on exam today -continue Quinapril 40mg  daily and Carvedilol 6.25mg  BID  3.  AAA -followed by PCP -I will get a copy of FLP from PCP as LDL should be < 70 with DM and AAA  4.  Type 2 DM -followed by PCP -diet controlled at present   Medication Adjustments/Labs and Tests Ordered: Current medicines are reviewed at length with the patient today.  Concerns regarding medicines are outlined above.  No orders of the defined types were placed in this encounter.  Meds ordered this encounter  Medications  . rivaroxaban (XARELTO) 20 MG TABS tablet    Sig: Take 1 tablet (20 mg total) by mouth daily with supper.    Dispense:  90 tablet    Refill:  1  . quinapril (ACCUPRIL) 40 MG tablet     Sig: Take 1 tablet (40 mg total) by mouth daily.    Dispense:  90 tablet    Refill:  3    Signed, Fransico Him, MD  03/09/2019 1:43 PM    [facility]

## 2019-08-30 ENCOUNTER — Other Ambulatory Visit: Payer: Self-pay | Admitting: Cardiology

## 2019-08-30 NOTE — Telephone Encounter (Signed)
Last OV 03/09/19 Scr 1.18 on 12/04/2018 Actual BW crcl 79

## 2019-09-09 ENCOUNTER — Encounter: Payer: Self-pay | Admitting: Physician Assistant

## 2019-09-09 NOTE — Progress Notes (Signed)
Virtual Visit via Telephone Note   This visit type was conducted due to national recommendations for restrictions regarding the COVID-19 Pandemic (e.g. social distancing) in an effort to limit this patient's exposure and mitigate transmission in our community.  Due to his co-morbid illnesses, this patient is at least at moderate risk for complications without adequate follow up.  This format is felt to be most appropriate for this patient at this time.  The patient did not have access to video technology/had technical difficulties with video requiring transitioning to audio format only (telephone).  All issues noted in this document were discussed and addressed.  No physical exam could be performed with this format.  Please refer to the patient's chart for his  consent to telehealth for [facility]   The patient was identified using 2 identifiers.  Date:  09/15/2019   ID:  Luis Adams, DOB 1951/04/09, MRN [id number]  Patient Location: Home Provider Location: Office  PCP:  Kathlee Nations, MD  Cardiologist:  Armanda Magic, MD Electrophysiologist:  None   Evaluation Performed:  Follow-Up Visit  Chief Complaint:  F/u afib, cardiac risk factors  History of Present Illness:    Luis Adams is a 69 y.o. male with paroxysmal atrial fibrillation, AAA (details unclear, followed by PCP), essential HTN, hyperlipidemia, pre-DM, arthritis, RBBB, habitual alcohol use who presents for routine follow-up.   He established care in 06/2018 with Dr. Mayford Knife for new diagnosis of atrial fib. He had noted his BP to be high at that time so he went to urgent care and was found to be in atrial fib RVR. Troponin was negative. He was started on Toprol and Xarelto and was back in NSR at OV 07/13/18. 2D echo 07/22/18 showed EF 55-60%, mildly enlarged RV with normal function, RVSP , mild aortic sclerosis. Nuc 07/22/18 was normal, EF 53%. He developed some dizziness with metoprolol (with normal HR and BP per report)  so was switched to diltiazem. Diltiazem was later changed to carvedilol for improved blood pressure control. Sleep study was ordered but don't see this was done. Last labs personally reviewed include [facility] 07/2019 Hgb 14.1, Plt 183, K 3.8, Cr 1.11, TSH wnl, 04/2019 normal LFTs, LDL 132, A1C 5.8.  He is seen back virtually by phone and is feeling well without acute complaint. No CP, SOB, palpitations, bleeding, syncope or dizziness. He opted against doing the sleep study. His PCP recently added and increased amlodipine to 5mg  daily for his blood pressure. His most recent readings have been right around 133-137 primarily, rare occasions 145. He is tolerating medicines without any adverse effects. He enjoys that the spring has come around so that he can be outdoors more.   Past Medical History:  Diagnosis Date  . AAA (abdominal aortic aneurysm) (HCC)   . Arthritis   . Benign essential HTN 07/13/2018  . Habitual alcohol use   . Hyperlipidemia   . PAF (paroxysmal atrial fibrillation) (HCC) 07/13/2018  . Prediabetes   . RBBB    Past Surgical History:  Procedure Laterality Date  . TONSILLECTOMY       Current Meds  Medication Sig  . amLODipine (NORVASC) 5 MG tablet Take 5 mg by mouth every evening.  . carvedilol (COREG) 6.25 MG tablet Take 1 tablet (6.25 mg total) by mouth 2 (two) times daily.  . quinapril (ACCUPRIL) 40 MG tablet Take 1 tablet (40 mg total) by mouth daily.  . sildenafil (REVATIO) 20 MG tablet Take 100 mg by mouth daily as needed (erectile  dysfunction).   . Tetrahydrozoline HCl (VISINE OP) Apply 1 drop to eye daily as needed (irritation/ dry eyes).  Alveda Reasons 20 MG TABS tablet TAKE 1 TABLET BY MOUTH ONCE DAILY WITH SUPPER     Allergies:   Patient has no known allergies.   Social History   Tobacco Use  . Smoking status: Never Smoker  . Smokeless tobacco: Never Used  Substance Use Topics  . Alcohol use: Yes    Alcohol/week: 6.0 standard drinks    Types: 6 Cans  of beer per week    Comment: reports drinking 1 can of beer daily  . Drug use: Never     Family Hx: The patient's family history includes Cancer in his maternal grandmother. He was adopted.  ROS:   Please see the history of present illness.    All other systems reviewed and are negative.   Prior CV studies:   The following studies were reviewed today:  2D Echo 06/2018 1. Sinus rhythm throughout exam.  2. The left ventricle has normal systolic function, with an ejection  fraction of 55-60%. The cavity size was normal. Left ventricular diastolic  parameters were normal No evidence of left ventricular regional wall  motion abnormalities.  3. No evidence of left ventricular regional wall motion abnormalities.  4. The right ventricle has normal systolic function. The cavity was  mildly enlarged. There is no increase in right ventricular wall thickness.  5. RVSP 30 mmHg, RA pressure estimate 3 mmHg.  6. The tricuspid valve is normal in structure.  7. The pulmonic valve was normal in structure.  8. The aortic valve is normal in structure. Mild sclerosis of the aortic  valve.  9. The mitral valve is normal in structure.     Labs/Other Tests and Data Reviewed:    EKG:  An ECG dated 12/03/2018 was personally reviewed today and demonstrated:  NSR 68bpm, RBBB, LPFB, nonspecific TW changes  Recent Labs: 12/04/2018: BUN 17; Creatinine, Ser 1.18; Potassium 4.4; Sodium 137; TSH 0.845   Recent Lipid Panel No results found for: CHOL, TRIG, HDL, CHOLHDL, LDLCALC, LDLDIRECT  Wt Readings from Last 3 Encounters:  09/15/19 203 lb (92.1 kg)  03/09/19 203 lb (92.1 kg)  12/04/18 198 lb (89.8 kg)     Objective:    Vital Signs:  BP 133/68   Pulse 61   Ht 6' (1.829 m)   Wt 203 lb (92.1 kg)   BMI 27.53 kg/m    VS reviewed. General - calm male in no acute distress Pulm - No labored breathing, no coughing during visit, no audible wheezing, speaking in full sentences Neuro - A+Ox3,  no slurred speech, answers questions appropriately Psych - Pleasant affect  ASSESSMENT & PLAN:    1. Paroxysmal atrial fibrillation - per primary care note 07/2019, sounded to be in NSR. Clinically [name] does not report any symptoms of breakthrough atrial fib. Continue Xarelto - labs in 07/2019 were acceptable. He does report it is pricey when he is in the donut hole so will forward information to patient assistance liaison to see if they can help. I also asked him to call his insurance company to inquire cost of Eliquis as an alternative (he would prefer to stay on Xarelto if possible). Given his HTN, mildly enlarged RV by prior echo and atrial fib, he would benefit from sleep study to exclude OSA as a contributing factor but does not wish to do so at this time. 2. AAA - he reports this is  followed yearly by primary care. Reports are not in Epic. He says it was stable, actually decreased, by last check. Aneurysm precautions reviewed to include tight blood pressure control, family member screening, lifting restrictions and avoidance of fluoroquinolones. See below regarding blood pressure. 3. Essential HTN - BP remains slightly above goal. I would favor increasing amlodipine to 10mg  daily but declines and wishes to try 5mg  daily for a little longer. He has close f/u with primary care to discuss continued blood pressure management. Also discussed reduction in sodium, ETOH, and achieving/maintaining healthy weight. 4. Hyperlipidemia - goal LDL <70 per Dr. note, in context of AAA and h/o blood sugar issues (he clarifies he has pre-DM, not DM). We discussed addition of cholesterol medication at this time and rationale for primary prevention. He politely declined and prefers to discuss further with his PCP in follow-up. He also has begun transitioning [employer] and ice cream to more greens and vegetables instead. 5. RBBB - no symptoms to suggest bradycardia at this time. Continue to monitor  clinically.   Time:   Today, I have spent 22 minutes with the patient with telehealth technology discussing the above problems.     Medication Adjustments/Labs and Tests Ordered: Current medicines are reviewed at length with the patient today.  Testing and concerns regarding medicines are outlined above.    Follow Up: In October 2020 with Dr. Norris Cross (6 months) in person  Signed, November 2020, PA-C  09/15/2019 12:23 PM    [facility]

## 2019-09-15 ENCOUNTER — Telehealth (INDEPENDENT_AMBULATORY_CARE_PROVIDER_SITE_OTHER): Payer: Medicare Other | Admitting: Physician Assistant

## 2019-09-15 ENCOUNTER — Telehealth: Payer: Self-pay | Admitting: *Deleted

## 2019-09-15 ENCOUNTER — Encounter: Payer: Self-pay | Admitting: Physician Assistant

## 2019-09-15 ENCOUNTER — Other Ambulatory Visit: Payer: Self-pay

## 2019-09-15 VITALS — BP 133/68 | HR 61 | Ht 72.0 in | Wt 203.0 lb

## 2019-09-15 DIAGNOSIS — I714 Abdominal aortic aneurysm, without rupture, unspecified: Secondary | ICD-10-CM

## 2019-09-15 DIAGNOSIS — I480 Paroxysmal atrial fibrillation: Secondary | ICD-10-CM | POA: Diagnosis not present

## 2019-09-15 DIAGNOSIS — I10 Essential (primary) hypertension: Secondary | ICD-10-CM

## 2019-09-15 DIAGNOSIS — E785 Hyperlipidemia, unspecified: Secondary | ICD-10-CM

## 2019-09-15 DIAGNOSIS — I4510 Unspecified right bundle-branch block: Secondary | ICD-10-CM

## 2019-09-15 NOTE — Patient Instructions (Addendum)
Medication Instructions:  Your physician recommends that you continue on your current medications as directed. Please refer to the Current Medication list given to you today.  *If you need a refill on your cardiac medications before your next appointment, please call your pharmacy*   Lab Work: None ordered  If you have labs (blood work) drawn today and your tests are completely normal, you will receive your results only by: Marland Kitchen MyChart Message (if you have MyChart) OR . A paper copy in the mail If you have any lab test that is abnormal or we need to change your treatment, we will call you to review the results.   Testing/Procedures: None ordered   Follow-Up: At [facility], you and your health needs are our priority.  As part of our continuing mission to provide you with exceptional heart care, we have created designated Provider Care Teams.  These Care Teams include your primary Cardiologist (physician) and Advanced Practice Providers (APPs -  Physician Assistants and Nurse Practitioners) who all work together to provide you with the care you need, when you need it.  We recommend signing up for the patient portal called "MyChart".  Sign up information is provided on this After Visit Summary.  MyChart is used to connect with patients for Virtual Visits (Telemedicine).  Patients are able to view lab/test results, encounter notes, upcoming appointments, etc.  Non-urgent messages can be sent to your provider as well.   To learn more about what you can do with MyChart, go to [email or web address].    Your next appointment:   6 month(s)  The format for your next appointment:   In Person  Provider:   You may see Armanda Magic, MD or one of the following Advanced Practice Providers on your designated Care Team:    Ronie Spies, PA-C  Jacolyn Reedy, PA-C  Other Instructions:  INFO FOR PT:  One of your tests has shown an aneurysm of your abdominal aorta. The word "aneurysm" refers to  a bulge in an artery (blood vessel). Most people think of them in the context of an emergency, but yours was found incidentally. At this point there is nothing you need to do from a procedure standpoint, but there are some important things to keep in mind for day-to-day life.   Mainstays of therapy for aneurysms include very good blood pressure control, healthy lifestyle, and avoiding tobacco products and street drugs. Research has raised concern that antibiotics in the fluoroquinolone class could be associated with increased risk of having an aneurysm develop or tear. This includes medicines that end in "floxacin," like Cipro or Levaquin. Make sure to discuss this information with other healthcare providers if you require antibiotics.   Since aneurysms can run in families, you should discuss your diagnosis with first degree relatives as they may need to be screened for this. Regular mild-moderate physical exercise is important, but avoid heavy lifting/weight lifting over 30lbs, chopping wood, shoveling snow or digging heavy earth with a shovel. It is best to avoid activities that cause grunting or straining (medically referred to as a "Valsalva maneuver"). This happens when a person bears down against a closed throat to increase the strength of arm or abdominal muscles. There's often a tendency to do this when lifting heavy weights, doing sit-ups, push-ups or chin-ups, etc., but it may be harmful.   This is a finding I would expect to be monitored periodically by your cardiology team. Most unruptured thoracic aortic aneurysms cause no symptoms, so they are often  found during exams for other conditions. Contact a health care provider if you develop any discomfort in your upper back, neck, abdomen, trouble swallowing, cough or hoarseness, or unexplained weight loss. Get help right away if you develop severe pain in your upper back or abdomen that may move into your chest and arms, or any other concerning  symptoms such as shortness of breath or fever.   Please monitor your blood pressure occasionally at home. Call your primary care doctor if you tend to get readings of greater than 130 on the top number or 80 on the bottom number.   Please talk to your primary care doctor about cholesterol medication. Given your aneurysm and blood sugar history, Dr. Radford Pax has recommended your goal LDL be less than 70.

## 2019-09-15 NOTE — Telephone Encounter (Signed)
  Patient Consent for Virtual Visit         Luis Adams has provided verbal consent on 09/15/2019 for a virtual visit (video or telephone).   CONSENT FOR VIRTUAL VISIT FOR:  Luis Adams  By participating in this virtual visit I agree to the following:  I hereby voluntarily request, consent and authorize [facility] and its employed or contracted physicians, physician assistants, nurse practitioners or other licensed health care professionals (the Practitioner), to provide me with telemedicine health care services (the "Services") as deemed necessary by the treating Practitioner. I acknowledge and consent to receive the Services by the Practitioner via telemedicine. I understand that the telemedicine visit will involve communicating with the Practitioner through live audiovisual communication technology and the disclosure of certain medical information by electronic transmission. I acknowledge that I have been given the opportunity to request an in-person assessment or other available alternative prior to the telemedicine visit and am voluntarily participating in the telemedicine visit.  I understand that I have the right to withhold or withdraw my consent to the use of telemedicine in the course of my care at any time, without affecting my right to future care or treatment, and that the Practitioner or I may terminate the telemedicine visit at any time. I understand that I have the right to inspect all information obtained and/or recorded in the course of the telemedicine visit and may receive copies of available information for a reasonable fee.  I understand that some of the potential risks of receiving the Services via telemedicine include:  Marland Kitchen Delay or interruption in medical evaluation due to technological equipment failure or disruption; . Information transmitted may not be sufficient (e.g. poor resolution of images) to allow for appropriate medical decision making by the Practitioner;  and/or  . In rare instances, security protocols could fail, causing a breach of personal health information.  Furthermore, I acknowledge that it is my responsibility to provide information about my medical history, conditions and care that is complete and accurate to the best of my ability. I acknowledge that Practitioner's advice, recommendations, and/or decision may be based on factors not within their control, such as incomplete or inaccurate data provided by me or distortions of diagnostic images or specimens that may result from electronic transmissions. I understand that the practice of medicine is not an exact science and that Practitioner makes no warranties or guarantees regarding treatment outcomes. I acknowledge that a copy of this consent can be made available to me via my patient portal [facility] MyChart), or I can request a printed copy by calling the office of [facility].    I understand that my insurance will be billed for this visit.   I have read or had this consent read to me. . I understand the contents of this consent, which adequately explains the benefits and risks of the Services being provided via telemedicine.  . I have been provided ample opportunity to ask questions regarding this consent and the Services and have had my questions answered to my satisfaction. . I give my informed consent for the services to be provided through the use of telemedicine in my medical care

## 2019-09-16 ENCOUNTER — Telehealth: Payer: Self-pay

## 2019-09-16 NOTE — Telephone Encounter (Signed)
Per message from Ronie Spies, PA-c as follows:  Please send message to Larita Fife Via/patient assistance because Xarelto is getting too expensive - any options for assistance? Thanks!    I called the pt who states that he was denied asst through the [facility] program ([place] medication asst program) and the [employer] pt asst program because he makes too much money.  We spoke about him switching to Coumadin (as it is the only anticoagulant that is a generic on the market at this time) until the 1st of the year when his ins starts to pay again.  He states he is not interested in taking Coumadin at his time and does have enough Xarelto to last until 10/30/2019 but will think about it and discuss with his PCP at his next OV that is coming up soon to see if the lab visits to check his INR and the cost of Coumadin combined would be less expensive than Xarelto and if it is he may reconsider this suggestion.  I did give him Laural Benes and [facility]'s phone number to call to see if there is any other asst they can offer him. He thanked me for calling him to discuss his options.

## 2019-11-30 ENCOUNTER — Other Ambulatory Visit: Payer: Self-pay | Admitting: Physician Assistant

## 2019-12-01 ENCOUNTER — Other Ambulatory Visit: Payer: Self-pay

## 2019-12-01 MED ORDER — CARVEDILOL 6.25 MG PO TABS: 6.2500 mg | ORAL_TABLET | Freq: Two times a day (BID) | ORAL | 2 refills | Status: DC

## 2020-02-21 ENCOUNTER — Other Ambulatory Visit: Payer: Self-pay | Admitting: Cardiology

## 2020-03-08 ENCOUNTER — Ambulatory Visit: Payer: Medicare Other | Admitting: Cardiology

## 2020-03-18 ENCOUNTER — Other Ambulatory Visit: Payer: Self-pay | Admitting: Cardiology

## 2020-03-20 NOTE — Telephone Encounter (Signed)
Prescription refill request for Xarelto received.  Indication:afib Last office visit:09/15/19 Weight:92.1 Age:69 Scr:1.08 on 11/17/19 in care everywhere CrCl: 64ml/min

## 2020-03-31 ENCOUNTER — Encounter: Payer: Self-pay | Admitting: Cardiology

## 2020-03-31 ENCOUNTER — Encounter (INDEPENDENT_AMBULATORY_CARE_PROVIDER_SITE_OTHER): Payer: Self-pay

## 2020-03-31 ENCOUNTER — Ambulatory Visit (INDEPENDENT_AMBULATORY_CARE_PROVIDER_SITE_OTHER): Payer: Medicare Other | Admitting: Cardiology

## 2020-03-31 ENCOUNTER — Other Ambulatory Visit: Payer: Self-pay

## 2020-03-31 VITALS — BP 154/88 | HR 57 | Ht 72.0 in | Wt 192.2 lb

## 2020-03-31 DIAGNOSIS — I209 Angina pectoris, unspecified: Secondary | ICD-10-CM

## 2020-03-31 DIAGNOSIS — I10 Essential (primary) hypertension: Secondary | ICD-10-CM

## 2020-03-31 DIAGNOSIS — E1100 Type 2 diabetes mellitus with hyperosmolarity without nonketotic hyperglycemic-hyperosmolar coma (NKHHC): Secondary | ICD-10-CM | POA: Diagnosis not present

## 2020-03-31 DIAGNOSIS — I714 Abdominal aortic aneurysm, without rupture, unspecified: Secondary | ICD-10-CM

## 2020-03-31 DIAGNOSIS — R079 Chest pain, unspecified: Secondary | ICD-10-CM

## 2020-03-31 DIAGNOSIS — I480 Paroxysmal atrial fibrillation: Secondary | ICD-10-CM

## 2020-03-31 DIAGNOSIS — E785 Hyperlipidemia, unspecified: Secondary | ICD-10-CM

## 2020-03-31 MED ORDER — AMLODIPINE BESYLATE 5 MG PO TABS: 7.5000 mg | ORAL_TABLET | Freq: Every day | ORAL | 3 refills | Status: AC

## 2020-03-31 NOTE — Progress Notes (Addendum)
Cardiology Office Note:    Date:  03/31/2020   ID:  Bader Stubblefield, DOB 20-Apr-1951, MRN [id number]  PCP:  Kathlee Nations, MD  Cardiologist:  Armanda Magic, MD    Referring MD: Kathlee Nations, MD   Chief Complaint  Patient presents with  . Atrial Fibrillation  . Hyperlipidemia  . Hypertension    History of Present Illness:    Luis Adams is a 69 y.o. male with a hx of paroxysmal atrial fibrillation, AAA (details unclear, followed by PCP), essential HTN, DM, RBBB, habitual alcohol.  2D echo 07/22/18 showed EF 55-60%, mildly enlarged RV.  Nuc 07/22/18 was normal, EF 53%. He developed some dizziness with metoprolol (with normal HR and BP per report) so was switched to diltiazem.  He is here today for followup and is doing well.  He denies any SOB, DOE, PND, orthopnea, LE edema, dizziness, palpitations or syncope. He tells me that if he gets emotionally stressed he may gets some pressure on his chest but none with exertional activity.  He walks daily for exercise.  He works a lot out in the yard he will have some chest discomfort across his chest that is dull ache with no radiation and no associated sx of SOB, nausea or diaphoresis.  He is compliant with his meds and is tolerating meds with no SE.    Past Medical History:  Diagnosis Date  . AAA (abdominal aortic aneurysm) (HCC)   . Arthritis   . Benign essential HTN 07/13/2018  . Habitual alcohol use   . Hyperlipidemia   . PAF (paroxysmal atrial fibrillation) (HCC) 07/13/2018  . Prediabetes   . RBBB     Past Surgical History:  Procedure Laterality Date  . TONSILLECTOMY      Current Medications: Current Meds  Medication Sig  . amLODipine (NORVASC) 5 MG tablet Take 5 mg by mouth every evening.  . carvedilol (COREG) 6.25 MG tablet Take 1 tablet (6.25 mg total) by mouth 2 (two) times daily.  . quinapril (ACCUPRIL) 40 MG tablet Take 1 tablet by mouth once daily  . sildenafil (REVATIO) 20 MG tablet Take 100 mg by mouth daily as needed  (erectile dysfunction).   . Tetrahydrozoline HCl (VISINE OP) Apply 1 drop to eye daily as needed (irritation/ dry eyes).  [name] 20 MG TABS tablet TAKE 1 TABLET BY MOUTH ONCE DAILY WITH SUPPER     Allergies:   Patient has no known allergies.   Social History   Socioeconomic History  . Marital status: Married    Spouse name: Not on file  . Number of children: Not on file  . Years of education: Not on file  . Highest education level: Not on file  Occupational History  . Not on file  Tobacco Use  . Smoking status: Never Smoker  . Smokeless tobacco: Never Used  Vaping Use  . Vaping Use: Never used  Substance and Sexual Activity  . Alcohol use: Yes    Alcohol/week: 6.0 standard drinks    Types: 6 Cans of beer per week    Comment: reports drinking 1 can of beer daily  . Drug use: Never  . Sexual activity: Not on file  Other Topics Concern  . Not on file  Social History Narrative  . Not on file   Social Determinants of Health   Financial Resource Strain:   . Difficulty of Paying Living Expenses: Not on file  Food Insecurity:   . Worried About Programme researcher, broadcasting/film/video in the Last  Year: Not on file  . Ran Out of Food in the Last Year: Not on file  Transportation Needs:   . Lack of Transportation (Medical): Not on file  . Lack of Transportation (Non-Medical): Not on file  Physical Activity:   . Days of Exercise per Week: Not on file  . Minutes of Exercise per Session: Not on file  Stress:   . Feeling of Stress : Not on file  Social Connections:   . Frequency of Communication with Friends and Family: Not on file  . Frequency of Social Gatherings with Friends and Family: Not on file  . Attends Religious Services: Not on file  . Active Member of Clubs or Organizations: Not on file  . Attends Banker Meetings: Not on file  . Marital Status: Not on file     Family History: The patient's family history includes Cancer in his maternal grandmother. He was  adopted.  ROS:   Please see the history of present illness.    ROS  All other systems reviewed and negative.   EKGs/Labs/Other Studies Reviewed:    The following studies were reviewed today: none  EKG:  EKG is not ordered today.   Recent Labs: No results found for requested labs within last 8760 hours.   Recent Lipid Panel No results found for: CHOL, TRIG, HDL, CHOLHDL, VLDL, LDLCALC, LDLDIRECT  Physical Exam:    VS:  BP (!) 154/88   Pulse (!) 57   Ht 6' (1.829 m)   Wt 192 lb 3.2 oz (87.2 kg)   BMI 26.07 kg/m     Wt Readings from Last 3 Encounters:  03/31/20 192 lb 3.2 oz (87.2 kg)  09/15/19 203 lb (92.1 kg)  03/09/19 203 lb (92.1 kg)     GEN: Well nourished, well developed in no acute distress HEENT: Normal NECK: No JVD; No carotid bruits LYMPHATICS: No lymphadenopathy CARDIAC:RRR, no murmurs, rubs, gallops RESPIRATORY:  Clear to auscultation without rales, wheezing or rhonchi  ABDOMEN: Soft, non-tender, non-distended MUSCULOSKELETAL:  No edema; No deformity  SKIN: Warm and dry NEUROLOGIC:  Alert and oriented x 3 PSYCHIATRIC:  Normal affect    ASSESSMENT:    1. PAF (paroxysmal atrial fibrillation) (HCC)   2. Benign essential HTN   3. AAA (abdominal aortic aneurysm) without rupture (HCC)   4. Type 2 diabetes mellitus with hyperosmolarity without coma, without long-term current use of insulin (HCC)   5. Hyperlipidemia, unspecified hyperlipidemia type   6. Chest pain of uncertain etiology    PLAN:    In order of problems listed above:  1.  PAF -denies any palpitations since I saw him last -Remains in NSR on exam today -continue Carvedilol and Xarelto 20mg  daily -Hbg 14.4 and SCr 1.01 in June 2021  2.  HTN -Bp borderline controlled on exam today -continue Quinapril 40mg  daily and Carvedilol 6.25mg  BID -increase amlodipine to 7.5mg  daily to keep BP at goal of < 130/49mmHg due to AAA  3.  AAA -followed by PCP  4.  Type 2 DM -followed by  PCP -HbA1C 5.6% in June 2021 -diet controlled at present  5.  HLD -followed by PCP -LDL 110 in June 2021 -LDL goal < 70 with DM and AAA -check FLP and ALT  6.  Chest pain -he has typical and atypical symptoms -given his DM, HLD, HTN and known vascular disease I am concerned he may be having angina -will get a coronary CTA to assess for CAD   Medication Adjustments/Labs and  Tests Ordered: Current medicines are reviewed at length with the patient today.  Concerns regarding medicines are outlined above.  Orders Placed This Encounter  Procedures  . EKG 12-Lead   No orders of the defined types were placed in this encounter.   Signed, Armanda Magic, MD  03/31/2020 11:50 AM    [facility]

## 2020-03-31 NOTE — Patient Instructions (Addendum)
Medication Instructions:  Your physician has recommended you make the following change in your medication:   Increase your Amlodipine to 7.$RemoveBeforeD'5mg'[id number]$  per day, 1 1/2 tablets by mouth per day   Labwork: You will have labs drawn today:   FLP and CMP   Testing/Procedures: Your physician has requested that you have cardiac CT. Cardiac computed tomography (CT) is a painless test that uses an x-ray machine to take clear, detailed pictures of your heart. For further information please visit [email or web address]. Please follow instruction sheet as given.    Follow-Up: Your physician recommends that you schedule a follow-up appointment in:   2 weeks with the Pharmacy Hypertension clinic for a BP check  12 months with Dr. Radford Pax  Any Other Special Instructions Will Be Listed Below (If Applicable).     If you need a refill on your cardiac medications before your next appointment, please call your pharmacy.  Your cardiac CT will be scheduled at one of the below locations:   [facility] [address] [place], [place] [address] [phone]  [facility] [address] [place], [place] [address] [phone]  If scheduled at [facility], please arrive at the [facility] main entrance of [facility] 30 minutes prior to test start time. Proceed to the [facility] Radiology Department (first floor) to check-in and test prep.  If scheduled at [facility], please arrive 15 mins early for check-in and test prep.  Please follow these instructions carefully (unless otherwise directed):  Hold all erectile dysfunction medications at least 3 days (72 hrs) prior to test.  On the Night Before the Test: . Be sure to Drink plenty of water. . Do not consume any caffeinated/decaffeinated beverages or chocolate 12 hours prior to your test. . Do not take any antihistamines 12 hours prior to your  test.  On the Day of the Test: . Drink plenty of water. Do not drink any water within one hour of the test. . Do not eat any food 4 hours prior to the test. . You may take your regular medications prior to the test.        After the Test: . Drink plenty of water. . After receiving IV contrast, you may experience a mild flushed feeling. This is normal. . On occasion, you may experience a mild rash up to 24 hours after the test. This is not dangerous. If this occurs, you can take Benadryl 25 mg and increase your fluid intake. . If you experience trouble breathing, this can be serious. If it is severe call 911 IMMEDIATELY. If it is mild, please call our office.  Once we have confirmed authorization from your insurance company, we will call you to set up a date and time for your test. Based on how quickly your insurance processes prior authorizations requests, please allow up to 4 weeks to be contacted for scheduling your Cardiac CT appointment. Be advised that routine Cardiac CT appointments could be scheduled as many as 8 weeks after your provider has ordered it.  For non-scheduling related questions, please contact the cardiac imaging nurse navigator should you have any questions/concerns: Marchia Bond, Cardiac Imaging Nurse Navigator Burley Saver, Interim Cardiac Imaging Nurse [place] [facility] Direct Office Dial: [phone]   For scheduling needs, including cancellations and rescheduling, please call Vivien Rota at [phone], option [phone].

## 2020-03-31 NOTE — Addendum Note (Signed)
Addended by: Oretha Milch on: 03/31/2020 12:20 PM   Modules accepted: Orders

## 2020-04-01 LAB — COMPREHENSIVE METABOLIC PANEL
ALT: 19 IU/L (ref 0–44)
AST: 19 IU/L (ref 0–40)
Albumin/Globulin Ratio: 1.9 (ref 1.2–2.2)
Albumin: 4.9 g/dL — ABNORMAL HIGH (ref 3.8–4.8)
Alkaline Phosphatase: 64 IU/L (ref 44–121)
BUN/Creatinine Ratio: 16 (ref 10–24)
BUN: 15 mg/dL (ref 8–27)
Bilirubin Total: 0.4 mg/dL (ref 0.0–1.2)
CO2: 24 mmol/L (ref 20–29)
Calcium: 9.6 mg/dL (ref 8.6–10.2)
Chloride: 104 mmol/L (ref 96–106)
Creatinine, Ser: 0.96 mg/dL (ref 0.76–1.27)
GFR calc Af Amer: 94 mL/min/{1.73_m2} (ref >59)
GFR calc non Af Amer: 81 mL/min/{1.73_m2} (ref >59)
Globulin, Total: 2.6 g/dL (ref 1.5–4.5)
Glucose: 91 mg/dL (ref 65–99)
Potassium: 4.4 mmol/L (ref 3.5–5.2)
Sodium: 140 mmol/L (ref 134–144)
Total Protein: 7.5 g/dL (ref 6.0–8.5)

## 2020-04-01 LAB — LIPID PANEL
Chol/HDL Ratio: 3.8 ratio (ref 0.0–5.0)
Cholesterol, Total: 206 mg/dL — ABNORMAL HIGH (ref 100–199)
HDL: 54 mg/dL (ref >39)
LDL Chol Calc (NIH): 120 mg/dL — ABNORMAL HIGH (ref 0–99)
Triglycerides: 182 mg/dL — ABNORMAL HIGH (ref 0–149)
VLDL Cholesterol Cal: 32 mg/dL (ref 5–40)

## 2020-04-04 ENCOUNTER — Telehealth: Payer: Self-pay | Admitting: Cardiology

## 2020-04-04 NOTE — Telephone Encounter (Signed)
The patient has been notified of the result and verbalized understanding. All questions (if any) were answered. Theresia Majors, RN 04/04/2020 1:27 PM  Patient states that he does not want to start on Lipitor. He has changed his diet and is having lab work done at his PCP at the end of the year and wants to wait to see if cholesterol has improved with diet change before starting on medication.

## 2020-04-04 NOTE — Telephone Encounter (Signed)
Patient returning call for lab results. 

## 2020-04-13 NOTE — Progress Notes (Signed)
Patient ID: Luis Adams                 DOB: 1950/06/13                      MRN: [id number]     HPI: [name] is a 69 y.o. male referred by Dr. Mayford Knife to HTN clinic. PMH is significant for paroxysmal atrial fibrillation, AAA (details unclear, followed by PCP), essential HTN, DM, RBBB, habitual alcohol.  2D echo 07/22/18 showed EF 55-60%, mildly enlarged RV. Nuc 07/22/18 was normal, EF 53%. He developed some dizziness with metoprolol (with normal HR and BP per report).   At last visit with Dr. Mayford Knife on 03/31/20, patient reported doing well and working a lot in the yard. BP at appointment was 154/88. Amlodipine was increased to 7.5 mg daily.  Patient presents today in good spirits. He denies dizziness/lightheadedness/headaches. He takes his blood pressure in the morning and evening after he takes his medications with an Omron monitor. He has had his monitor about 2 years. He uses appropriate technique when monitoring his blood pressure.  Home readings ranged from 112-131/67-82 mmHg with only 1 reading above goal out of ~12 readings. He reports getting nervous in clinic settings and that his BP is always higher when he goes to the doctor. Patient has made dietary changes since developing HTN and diabetes. He now eats a diet rich in vegetables and lean meats. He walks about 45 minutes per day with his dog Molly.    Current HTN meds: quinapril 40 mg daily (AM), carvedilol 6.25 mg BID, amlodipine 7.5 mg daily (PM) Previously tried: beta-blockers and ACE-inhibitors (unsure of which, patient previously managed outside of [facility]) BP goal: <130/80 mmHg  Family History: The patient's family history includes Cancer in his maternal grandmother. He was adopted.  Social History: Patient quit smoking in 1977, Patient reports never using drugs.  Diet:  Breakfast: plain egg white, piece of toast  Lunch: salad most of the time with lettuce tomato, catalina fat-free dressing  Dinner: baked or boiled chicken,  sweet potato and broccoli or salad  Snacks: salted peanuts, carrots  Drink: Coffee diluted with water with breakfast most days  Exercise: walks 3 times per day for about 15 minutes at a time unless it is raining  Home BP readings: Has Omron monitor  AM readings 112-128/73-74  PM readings 115-131/67-82  Wt Readings from Last 3 Encounters:  03/31/20 192 lb 3.2 oz (87.2 kg)  09/15/19 203 lb (92.1 kg)  03/09/19 203 lb (92.1 kg)   BP Readings from Last 3 Encounters:  04/14/20 (!) 158/78  03/31/20 (!) 154/88  09/15/19 133/68   Pulse Readings from Last 3 Encounters:  04/14/20 65  03/31/20 (!) 57  09/15/19 61    Renal function: CrCl cannot be calculated (Unknown ideal weight.).  Past Medical History:  Diagnosis Date  . AAA (abdominal aortic aneurysm) (HCC)   . Arthritis   . Benign essential HTN 07/13/2018  . Habitual alcohol use   . Hyperlipidemia   . PAF (paroxysmal atrial fibrillation) (HCC) 07/13/2018  . Prediabetes   . RBBB     Current Outpatient Medications on File Prior to Visit  Medication Sig Dispense Refill  . amLODipine (NORVASC) 5 MG tablet Take 1.5 tablets (7.5 mg total) by mouth daily. 135 tablet 3  . carvedilol (COREG) 6.25 MG tablet Take 1 tablet (6.25 mg total) by mouth 2 (two) times daily. 180 tablet 2  . quinapril (ACCUPRIL) 40  MG tablet Take 1 tablet by mouth once daily 90 tablet 1  . sildenafil (REVATIO) 20 MG tablet Take 100 mg by mouth daily as needed (erectile dysfunction).     . Tetrahydrozoline HCl (VISINE OP) Apply 1 drop to eye daily as needed (irritation/ dry eyes).    [name] 20 MG TABS tablet TAKE 1 TABLET BY MOUTH ONCE DAILY WITH SUPPER 30 tablet 5   No current facility-administered medications on file prior to visit.    No Known Allergies  Blood pressure (!) 158/78, pulse 65.   Assessment/Plan:  1. Hypertension - Patient presents with BP above goal of <130/80 in clinic. >90% of home BP readings at goal. Patient has history of  elevated BP in clinic compared to home readings consistent with possible white coat hypertension. Will calibrate patient BP monitor to ensure monitor is accurate. Considering most of patient's home BP readings are at goal and patient tends to have higher BP in a clinic setting, will continue current regimen of quinapril 40 mg daily, amlodipine 7.5 mg daily, and carvedilol 6.25 mg BID. Discussed additional dietary modifications including eating unsalted peanuts and making salad dressing at home to avoid added salt and sugar. Encouraged patient to continue walking about 45 minutes per day.  Will calibrate BP monitor on 04/18/20 after patient's cardiac CT.    Thank you,  Idamae Lusher  PharmD Candidate, Class of 2022  Olene Floss, Pharm.D, BCPS, CPP [facility]  [address], [place], [place] [address]  Phone: [phone]; Fax: [phone]

## 2020-04-14 ENCOUNTER — Telehealth (HOSPITAL_COMMUNITY): Payer: Self-pay | Admitting: Emergency Medicine

## 2020-04-14 ENCOUNTER — Other Ambulatory Visit: Payer: Self-pay

## 2020-04-14 ENCOUNTER — Ambulatory Visit (INDEPENDENT_AMBULATORY_CARE_PROVIDER_SITE_OTHER): Payer: Medicare Other | Admitting: Pharmacist

## 2020-04-14 VITALS — BP 158/78 | HR 65

## 2020-04-14 DIAGNOSIS — I209 Angina pectoris, unspecified: Secondary | ICD-10-CM

## 2020-04-14 DIAGNOSIS — I10 Essential (primary) hypertension: Secondary | ICD-10-CM

## 2020-04-14 NOTE — Patient Instructions (Addendum)
It was a pleasure seeing you today!   Continue your current medications: quinapril 40 mg daily, carvedilol 6.25 mg twice daily, and amlodipine 7.5 mg daily.   Try to make your own salad dressings or use the Skinnygirl vinagrette as an alternative. Try to eat unsalted peanuts instead of salted.   We will make sure your blood pressure monitor is working correctly. After your appointment with Dr. Mayford Knife on 04/18/20, come by the clinic with your blood pressure monitor so we can calibrate it.

## 2020-04-14 NOTE — Telephone Encounter (Signed)
Attempted to call patient regarding upcoming cardiac CT appointment. °Left message on voicemail with name and callback number °Marlyn Rabine RN Navigator Cardiac Imaging °[facility] °[phone] Office °[phone] Cell ° °

## 2020-04-14 NOTE — Telephone Encounter (Signed)
Attempted to call patient regarding upcoming cardiac CT appointment. °Left message on voicemail with name and callback number °Icker Swigert RN Navigator Cardiac Imaging °[facility] °[phone] Office °[phone] Cell ° °

## 2020-04-17 ENCOUNTER — Telehealth (HOSPITAL_COMMUNITY): Payer: Self-pay | Admitting: Emergency Medicine

## 2020-04-17 NOTE — Telephone Encounter (Signed)
Spoke with patient. Ok to have pt come on Wed 11/24 after his CT to have his BP cuff calibrated since pt lives >1hr away,

## 2020-04-17 NOTE — Telephone Encounter (Signed)
Pt calling stating he spent a good deal of time outside and now has a head cold. Questioning whether or not he should cancel his CCTA appt.  Pt then added that he took his ED medication last night and that he didn't realize he was supposed to hold it for 3 days prior to scan. I suggested that we postpone his appt.   Was able to r/s for 04/19/20 at 1:00p.   Pt added that he was supposed to have BP cuff calibrated and wanted to know if he could do this after his CT appt.   Rockwell Alexandria RN Navigator Cardiac Imaging [facility] [phone] Office  [phone] Cell

## 2020-04-17 NOTE — Telephone Encounter (Signed)
Reaching out to patient to offer assistance regarding upcoming cardiac imaging study; pt verbalizes understanding of appt date/time, parking situation and where to check in, pre-test NPO status and medications ordered, and verified current allergies; name and call back number provided for further questions should they arise Alliana Mcauliff RN Navigator Cardiac Imaging [facility] [phone] office [phone] cell 

## 2020-04-18 ENCOUNTER — Ambulatory Visit (HOSPITAL_COMMUNITY): Payer: Medicare Other

## 2020-04-19 ENCOUNTER — Ambulatory Visit (HOSPITAL_COMMUNITY)
Admission: RE | Admit: 2020-04-19 | Discharge: 2020-04-19 | Disposition: A | Discharge status: Home / Self Care | Payer: Medicare Other | Source: Ambulatory Visit | Attending: Cardiology | Admitting: Cardiology

## 2020-04-19 ENCOUNTER — Other Ambulatory Visit: Payer: Self-pay

## 2020-04-19 ENCOUNTER — Telehealth: Payer: Self-pay | Admitting: Pharmacist

## 2020-04-19 DIAGNOSIS — I209 Angina pectoris, unspecified: Secondary | ICD-10-CM

## 2020-04-19 DIAGNOSIS — R079 Chest pain, unspecified: Secondary | ICD-10-CM | POA: Diagnosis not present

## 2020-04-19 MED ORDER — NITROGLYCERIN 0.4 MG SL SUBL
SUBLINGUAL_TABLET | SUBLINGUAL | Status: AC
  Filled 2020-04-19: qty 2

## 2020-04-19 MED ORDER — METOPROLOL TARTRATE 5 MG/5ML IV SOLN
INTRAVENOUS | Status: AC
  Filled 2020-04-19: qty 5

## 2020-04-19 MED ORDER — METOPROLOL TARTRATE 5 MG/5ML IV SOLN: 5.0000 mg | INTRAVENOUS | Status: DC | PRN

## 2020-04-19 MED ORDER — NITROGLYCERIN 0.4 MG SL SUBL
0.8000 mg | SUBLINGUAL_TABLET | Freq: Once | SUBLINGUAL | Status: AC
  Administered 2020-04-19: 0.8 mg via SUBLINGUAL

## 2020-04-19 MED ORDER — IOHEXOL 350 MG/ML SOLN
80.0000 mL | Freq: Once | INTRAVENOUS | Status: AC | PRN
  Administered 2020-04-19: 80 mL via INTRAVENOUS

## 2020-04-19 NOTE — Telephone Encounter (Signed)
Patient stopped by clinic- per our request to have his home BP monitor checked. Patient has an upper arm OMRON BP cuff  144/82- clinic reading 144/85- home BP cuff reading  Patients home monitor appears accurate, therefore his blood pressure appears to be at goal based off of home readings. I will have patient continue quinapril 40 mg daily (AM), carvedilol 6.25 mg BID, amlodipine 7.5 mg daily (PM). I will call pt in 1 month to follow up.

## 2020-04-19 NOTE — Progress Notes (Signed)
CT scan completed. Tolerated well. D/C home ambulatory awake and alert, in no distress

## 2020-04-25 ENCOUNTER — Telehealth: Payer: Self-pay | Admitting: Cardiology

## 2020-04-25 NOTE — Telephone Encounter (Signed)
New message:      Patient calling to get some results from a test.

## 2020-04-25 NOTE — Telephone Encounter (Signed)
Spoke with the patient and advised him that non-cardiac portion of chest CT was normal. We are still waiting for cardiac portion to be read and I will let him know once we have results. Patient verbalized understanding and thanked me for the call.

## 2020-05-03 ENCOUNTER — Other Ambulatory Visit: Payer: Self-pay | Admitting: Cardiology

## 2020-05-03 ENCOUNTER — Ambulatory Visit (HOSPITAL_COMMUNITY)
Admission: RE | Admit: 2020-05-03 | Discharge: 2020-05-03 | Disposition: A | Discharge status: Home / Self Care | Payer: Medicare Other | Source: Ambulatory Visit | Attending: Cardiology | Admitting: Cardiology

## 2020-05-03 DIAGNOSIS — R931 Abnormal findings on diagnostic imaging of heart and coronary circulation: Secondary | ICD-10-CM

## 2020-05-03 DIAGNOSIS — R072 Precordial pain: Secondary | ICD-10-CM

## 2020-05-04 ENCOUNTER — Ambulatory Visit (HOSPITAL_COMMUNITY)
Admission: RE | Admit: 2020-05-04 | Discharge: 2020-05-04 | Disposition: A | Discharge status: Home / Self Care | Payer: Medicare Other | Source: Ambulatory Visit | Attending: Cardiology | Admitting: Cardiology

## 2020-05-04 ENCOUNTER — Other Ambulatory Visit (HOSPITAL_COMMUNITY): Payer: Self-pay | Admitting: Emergency Medicine

## 2020-05-04 DIAGNOSIS — R079 Chest pain, unspecified: Secondary | ICD-10-CM

## 2020-05-04 DIAGNOSIS — I2510 Atherosclerotic heart disease of native coronary artery without angina pectoris: Secondary | ICD-10-CM | POA: Diagnosis not present

## 2020-05-08 ENCOUNTER — Telehealth: Payer: Self-pay | Admitting: Cardiology

## 2020-05-08 NOTE — Telephone Encounter (Signed)
Patient is returning phone call, no documentation of who called. Please advise.

## 2020-05-08 NOTE — Progress Notes (Signed)
.   Cardiology Office Note   Date:  05/10/2020   ID:  Luis Adams, DOB 03-08-1951, MRN [id number]  PCP:  Kathlee Nations, MD  Cardiologist:  Dr. Mayford Knife, MD   Chief Complaint  Patient presents with   Follow-up   History of Present Illness: Luis Adams is a 69 y.o. male who presents for precardiac cath evaluation, seen for Dr. Mayford Knife.  Luis Adams has a hx of paroxysmal atrial fibrillation, AAA (details unclear, followed by PCP), essential HTN, DM, RBBB, habitual alcohol.  2D echo 07/22/18 showed EF 55-60%, mildly enlarged RV. Nuc 07/22/18 was normal, EF 53%. He developed some dizziness with metoprolol (with normal HR and BP per report) so was switched to diltiazem.  He was recently seen by Dr. Mendel Corning and reported chest pain which occurred under stress and when he was emotionally stressed. Due to chest pain, coronary CTA 04/19/2020 was obtained which showed a coronary calcium score of 2413 which is 96 percentile for age and sex matched control.  There was moderate atherosclerosis and significant blooming artifact in the mid and distal LCx with plans for FFR flow analysis.  This showed possible hemodynamically flow-limiting lesion in the proximal to mid RCA and distal LAD lesions with recommendations for cardiac catheterization.  He presents today for follow-up and the plan was for [facility] set up however patient is very reluctant and wishes to follow with his PCP next week to discuss. He also mentions possibly wanting a second opinion. We had a long discussion about the specific findings and the process of a cath. He feels more at ease however still wishes to see his PCP before proceeding. He has not had recurrent chest pain and denies SOB, LE edema, palpitations, dizziness or syncope.    Past Medical History:  Diagnosis Date   AAA (abdominal aortic aneurysm) (HCC)    Arthritis    Benign essential HTN 07/13/2018   Habitual alcohol use    Hyperlipidemia    PAF (paroxysmal atrial  fibrillation) (HCC) 07/13/2018   Prediabetes    RBBB     Past Surgical History:  Procedure Laterality Date   TONSILLECTOMY      Current Outpatient Medications  Medication Sig Dispense Refill   amLODipine (NORVASC) 5 MG tablet Take 1.5 tablets (7.5 mg total) by mouth daily. 135 tablet 3   carvedilol (COREG) 6.25 MG tablet Take 1 tablet (6.25 mg total) by mouth 2 (two) times daily. 180 tablet 2   quinapril (ACCUPRIL) 40 MG tablet Take 1 tablet by mouth once daily 90 tablet 1   sildenafil (REVATIO) 20 MG tablet Take 100 mg by mouth daily as needed (erectile dysfunction).      Tetrahydrozoline HCl (VISINE OP) Apply 1 drop to eye daily as needed (irritation/ dry eyes).     XARELTO 20 MG TABS tablet TAKE 1 TABLET BY MOUTH ONCE DAILY WITH SUPPER 30 tablet 5   nitroGLYCERIN (NITROSTAT) 0.4 MG SL tablet Place 1 tablet (0.4 mg total) under the tongue every 5 (five) minutes as needed for chest pain. 75 tablet 1   No current facility-administered medications for this visit.    Allergies:   Patient has no known allergies.    Social History:  The patient  reports that he has never smoked. He has never used smokeless tobacco. He reports current alcohol use of about 6.0 standard drinks of alcohol per week. He reports that he does not use drugs.   Family History:  The patient's family history includes Cancer in his  maternal grandmother. He was adopted.    ROS:  Please see the history of present illness.   Otherwise, review of systems are positive for none.   All other systems are reviewed and negative.    PHYSICAL EXAM: VS:  BP (!) 158/88    Pulse 68    Ht 6' (1.829 m)    Wt 189 lb (85.7 kg)    SpO2 98%    BMI 25.63 kg/m  , BMI Body mass index is 25.63 kg/m.   General: Well developed, well nourished, NAD Lungs:Clear to ausculation bilaterally. No wheezes, rales, or rhonchi. Breathing is unlabored. Cardiovascular: RRR with S1 S2. No murmurs Extremities: No edema. Radial pulses 2+  bilaterally Neuro: Alert and oriented. No focal deficits. No facial asymmetry. MAE spontaneously. Psych: Responds to questions appropriately with normal affect.     EKG:  EKG is ordered today. The ekg ordered today demonstrates NSR with HR 62bpm   Recent Labs: 03/31/2020: ALT 19; BUN 15; Creatinine, Ser 0.96; Potassium 4.4; Sodium 140    Lipid Panel    Component Value Date/Time   CHOL 206 (H) 03/31/2020 1235   TRIG 182 (H) 03/31/2020 1235   HDL 54 03/31/2020 1235   CHOLHDL 3.8 03/31/2020 1235   LDLCALC 120 (H) 03/31/2020 1235    Wt Readings from Last 3 Encounters:  05/10/20 189 lb (85.7 kg)  03/31/20 192 lb 3.2 oz (87.2 kg)  09/15/19 203 lb (92.1 kg)    Other studies Reviewed: Additional studies/ records that were reviewed today include:  Review of the above records demonstrates:   Coronary CTA with FFR 05/08/2020:  FINDINGS: FFRct analysis was performed on the original cardiac CT angiogram dataset. Diagrammatic representation of the FFRct analysis is provided in a separate PDF document in PACS. This dictation was created using the PDF document and an interactive 3D model of the results. 3D model is not available in the EMR/PACS. Normal FFR range is >0.80.  1. Left Main: No significant stenosis. LM FFR = 0.99.  2. LAD: Possible stenosis of distal LAD. Proximal FFR = 0.91, Mid FFR = 0.85, Distal FFR = 0.72. Distal D1 FFR = 0.74.  3. LCX: No significant stenosis. Proximal FFR = 0.95, Mid LAD = 0.92, Mid FFR = 0.91.  4. OM1: No significant stenosis Proximal FFR = 0.93, Mid FFR = 0.93.  5. RCA: No significant stenosis. Proximal FFR = 0.99, Mid FFR = 0.78, Distal FFR = 0.61. PDA FFR = 0.54. RPLA 0.58.  IMPRESSION: 1. Coronary CT FFR flow analysis demonstrates a possible hemodynamically flow limiting lesion in the proximal to mid RCA and distal LAD.  2.   Recommend cardiac catheterization.  ASSESSMENT AND PLAN:  1. CAD per cCTA: -Reported stress  induced chest pain and underwent cCTA. FFR flow analysis demonstrates a possible hemodynamically flow limiting lesion in the proximal to mid RCA and distal LAD. -Plan was for follow up to discuss LHC however patient is very reluctant to proceed and wished to discuss with his PCP next week then let the office know his decision  -Provide SL NTG in the meantime>>discussed administration   2. PAF: -EKG with NSR today  -Denies any palpitations since I saw him last -Continue carvedilol and Xarelto 20mg  daily -Will need to hold Xarelto prior to [facility] is decision is to proceed  3.  HTN: -Elevated today however reports he is nervous  -Continue current regimen   4.  AAA: -Followed by PCP  5.  DM2: -followed by PCP -HbA1C  5.6% in June 2021 -diet controlled at present  6.  HLD: -ast LDL, 120 on 03/31/20 -LDL goal <70 -Will need to add statin therapy however wishes to wait on this for now   i  Current medicines are reviewed at length with the patient today.  The patient does not have concerns regarding medicines.  The following changes have been made:  no change  Labs/ tests ordered today include: None  No orders of the defined types were placed in this encounter.    Disposition:   FU with my self or Dr. Mayford Knife in 2 months  Signed, Georgie Chard, NP  05/10/2020 12:33 PM    [facility] [address] [place], [place], [place]  [address] Phone: [phone]; Fax: [phone]

## 2020-05-08 NOTE — Telephone Encounter (Signed)
Quintella Reichert, MD  05/08/2020 12:39 PM EST Back to Top     I have tried to contact patient regarding abnormal coronary CTA FFR and cannot get in touch with him or his wife. Please try calling back today to let him know that it appears his blockages are flow limiting in the LAD and RCA and recommend cath. He is on Xarelto so please get him set up for cath later this week. He will need to hold Xarelto for 48 hours prior to cath. Please set up virtual appt with PA prior to cath this week for H&P and consent.    The patient has been notified of the result and verbalized understanding.  All questions (if any) were answered. Theresia Majors, RN 05/08/2020 4:42 PM  Patient is scheduled to see Elizeo Rodriques 12/15.

## 2020-05-10 ENCOUNTER — Ambulatory Visit (INDEPENDENT_AMBULATORY_CARE_PROVIDER_SITE_OTHER): Payer: Medicare Other | Admitting: Cardiology

## 2020-05-10 ENCOUNTER — Other Ambulatory Visit: Payer: Self-pay

## 2020-05-10 ENCOUNTER — Encounter: Payer: Self-pay | Admitting: Cardiology

## 2020-05-10 VITALS — BP 158/88 | HR 68 | Ht 72.0 in | Wt 189.0 lb

## 2020-05-10 DIAGNOSIS — I10 Essential (primary) hypertension: Secondary | ICD-10-CM

## 2020-05-10 DIAGNOSIS — R079 Chest pain, unspecified: Secondary | ICD-10-CM | POA: Diagnosis not present

## 2020-05-10 DIAGNOSIS — R931 Abnormal findings on diagnostic imaging of heart and coronary circulation: Secondary | ICD-10-CM

## 2020-05-10 DIAGNOSIS — E785 Hyperlipidemia, unspecified: Secondary | ICD-10-CM

## 2020-05-10 DIAGNOSIS — E1100 Type 2 diabetes mellitus with hyperosmolarity without nonketotic hyperglycemic-hyperosmolar coma (NKHHC): Secondary | ICD-10-CM | POA: Diagnosis not present

## 2020-05-10 DIAGNOSIS — I480 Paroxysmal atrial fibrillation: Secondary | ICD-10-CM

## 2020-05-10 MED ORDER — NITROGLYCERIN 0.4 MG SL SUBL: 0.4000 mg | SUBLINGUAL_TABLET | SUBLINGUAL | 1 refills | Status: DC | PRN

## 2020-05-10 NOTE — Patient Instructions (Signed)
Medication Instructions:  Your physician has recommended you make the following change in your medication:  1. START NITROGLYCERIN 0.4 MG UNDER THE TONGUE AS NEEDED FOR CHEST PAIN   *If you need a refill on your cardiac medications before your next appointment, please call your pharmacy*   Lab Work: NONE If you have labs (blood work) drawn today and your tests are completely normal, you will receive your results only by: Marland Kitchen MyChart Message (if you have MyChart) OR . A paper copy in the mail If you have any lab test that is abnormal or we need to change your treatment, we will call you to review the results.   Testing/Procedures: NONE  Follow-Up: At [facility], you and your health needs are our priority.  As part of our continuing mission to provide you with exceptional heart care, we have created designated Provider Care Teams.  These Care Teams include your primary Cardiologist (physician) and Advanced Practice Providers (APPs -  Physician Assistants and Nurse Practitioners) who all work together to provide you with the care you need, when you need it.  We recommend signing up for the patient portal called "MyChart".  Sign up information is provided on this After Visit Summary.  MyChart is used to connect with patients for Virtual Visits (Telemedicine).  Patients are able to view lab/test results, encounter notes, upcoming appointments, etc.  Non-urgent messages can be sent to your provider as well.   To learn more about what you can do with MyChart, go to [email or web address].    Your next appointment:   2 months  The format for your next appointment:   In Person  Provider:   You may see Armanda Magic, MD or one of the following Advanced Practice Providers on your designated Care Team:    Tereso Newcomer, PA-C  [place], [place]

## 2020-05-11 NOTE — Addendum Note (Signed)
Addended by: Oleta Mouse on: 05/11/2020 11:54 AM   Modules accepted: Orders

## 2020-05-17 ENCOUNTER — Telehealth: Payer: Self-pay | Admitting: Pharmacist

## 2020-05-17 NOTE — Telephone Encounter (Signed)
Called patient as a follow up. Patient states that he has been checking his blood pressure every day. The lowest reading was 98/58 HR 60, but he had given 3 tubes of blood that day. Blood pressure has been mostly 123-127 systolic/60-70's. Highest was 130/74 but this was only one time. Advised patient to continue on his current medication regimen. Follow up as needed. He will see PCP today to discuss cath.

## 2020-05-24 ENCOUNTER — Telehealth: Payer: Self-pay | Admitting: Cardiology

## 2020-05-24 ENCOUNTER — Encounter: Payer: Self-pay | Admitting: *Deleted

## 2020-05-24 DIAGNOSIS — R931 Abnormal findings on diagnostic imaging of heart and coronary circulation: Secondary | ICD-10-CM

## 2020-05-24 DIAGNOSIS — R079 Chest pain, unspecified: Secondary | ICD-10-CM

## 2020-05-24 NOTE — Telephone Encounter (Signed)
New Message:    Please call, pt wants Noreene Larsson to know he is ready to schedule his procedure.

## 2020-05-24 NOTE — Telephone Encounter (Signed)
Returned call to pt.  He is ready to schedule his LHC for chest pain. Will schedule and get instructions together and call pt back and also pt aware will send copy of instructions to mychart.      [facility] CARDIOVASCULAR DIVISION [facility] ST OFFICE [address] [place] [address] Dept: [phone] Loc: [phone]  Nihar Klus  05/24/2020  You are scheduled for a Cardiac Catheterization on Wednesday, January 5 with Dr. Bryan Lemma.  1. Please arrive at the [facility] (Main Entrance A) at [facility]: [address] [place], [place] [address] at 11:00 AM (This time is two hours before your procedure to ensure your preparation). Free valet parking service is available.   Special note: Every effort is made to have your procedure done on time. Please understand that emergencies sometimes delay scheduled procedures.  2. Diet: Do not eat solid foods after midnight.  The patient may have clear liquids until 5am upon the day of the procedure.  3. Labs: You will need to have blood drawn on Monday, January 3 at [facility] at [facility]. [address], [place]  Open: 7:30am - 5pm    Phone: [phone]. You do not need to be fasting.  COVID TESTING:    Due to recent COVID-19 restrictions implemented by our local and state authorities and in an effort to keep both patients and staff as safe as possible, our hospital system requires COVID-19 testing prior to certain scheduled hospital procedures.  Please go to [address] [place], [place] [address] on 05/29/2020 at 1:15 PM   .  This is a drive up testing site.  You will not need to exit your vehicle.  You will not be billed at the time of testing but may receive a bill later depending on your insurance. You must agree to self-quarantine from the time of your testing until the procedure date on 05/31/2020.  This should included staying home with ONLY the  people you live with.  Avoid take-out, grocery store shopping or leaving the house for any non-emergent reason.  Failure to have your COVID-19 test done on the date and time you have been scheduled will result in cancellation of your procedure.  Please call our office at [phone] if you have any questions.  4. Medication instructions in preparation for your procedure:   Contrast Allergy: No  Stop taking Xarelto (Rivaroxaban) on Monday, January 3.  Stop taking, Quinapril (Acupril) Tuesday, January 4,  COVID TESTING:    On the morning of your procedure, take your Aspirin and any morning medicines NOT listed above.  You may use sips of water.  5. Plan for one night stay--bring personal belongings. 6. Bring a current list of your medications and current insurance cards. 7. You MUST have a responsible person to drive you home. 8. Someone MUST be with you the first 24 hours after you arrive home or your discharge will be delayed. 9. Please wear clothes that are easy to get on and off and wear slip-on shoes.  Thank you for allowing [place] to care for you!   -- [facility]

## 2020-05-25 NOTE — Telephone Encounter (Signed)
Patients wife was exposed to covid. Patient has symptoms of runny nose, coughing, sneezing, and body aches. No fevers at this time. Patient and wife are quarantining until 06/05/20. Canceled cath and pre procedure testing. Patient to call office on 1/10 with update and plan to reschedule.   Will forward to [place] to follow up

## 2020-05-25 NOTE — Telephone Encounter (Signed)
Patient states he will need too reschedule his procedure because his wife has been exposed to COVID. Please return call to assist.

## 2020-05-29 ENCOUNTER — Other Ambulatory Visit (HOSPITAL_COMMUNITY): Payer: Medicare Other

## 2020-05-31 ENCOUNTER — Encounter (HOSPITAL_COMMUNITY): Admission: RE | Payer: Self-pay | Source: Home / Self Care

## 2020-05-31 ENCOUNTER — Ambulatory Visit (HOSPITAL_COMMUNITY): Admission: RE | Admit: 2020-05-31 | Payer: Medicare Other | Source: Home / Self Care | Admitting: Cardiology

## 2020-05-31 SURGERY — LEFT HEART CATH AND CORONARY ANGIOGRAPHY
Anesthesia: LOCAL

## 2020-06-01 ENCOUNTER — Telehealth: Payer: Self-pay

## 2020-06-01 DIAGNOSIS — E785 Hyperlipidemia, unspecified: Secondary | ICD-10-CM

## 2020-06-01 MED ORDER — ATORVASTATIN CALCIUM 20 MG PO TABS: 20.0000 mg | ORAL_TABLET | Freq: Every day | ORAL | 3 refills | Status: DC

## 2020-06-01 NOTE — Telephone Encounter (Signed)
Left message for patient to call back  

## 2020-06-01 NOTE — Telephone Encounter (Signed)
Spoke with the patient and advised him that Dr. Mayford Knife would like for him to start lipitor 20 mg daily. He states that he currently has COVID and has had to reschedule his heart cath. He states that he will start taking Lipitor but would like to wait until he gets over COVID and has his procedures rescheduled.

## 2020-06-01 NOTE — Telephone Encounter (Signed)
Lab work received from patient's PCP. LDL is at 95.4 and per Dr. Mayford Knife goal is <70, needs to start Lipitor 20 mg per day and have FLP/ALT in 6 weeks.

## 2020-06-07 ENCOUNTER — Telehealth: Payer: Self-pay | Admitting: Cardiology

## 2020-06-07 NOTE — Telephone Encounter (Signed)
Patient called to schedule heart cath. Plesase call

## 2020-06-08 NOTE — Telephone Encounter (Signed)
Patient calling back to schedule his cath. He states he does still have a cold.

## 2020-06-08 NOTE — Telephone Encounter (Signed)
Please set up virtual visit with PA in 1 week to reassess

## 2020-06-08 NOTE — Telephone Encounter (Signed)
Pt to have a virtual visit... 06/14/20 with Georgie Chard NP.    Patient Consent for Virtual Visit         Luis Adams has provided verbal consent on 06/08/2020 for a virtual visit (video or telephone).   CONSENT FOR VIRTUAL VISIT FOR:  Luis Adams  By participating in this virtual visit I agree to the following:  I hereby voluntarily request, consent and authorize [facility] and its employed or contracted physicians, physician assistants, nurse practitioners or other licensed health care professionals (the Practitioner), to provide me with telemedicine health care services (the "Services") as deemed necessary by the treating Practitioner. I acknowledge and consent to receive the Services by the Practitioner via telemedicine. I understand that the telemedicine visit will involve communicating with the Practitioner through live audiovisual communication technology and the disclosure of certain medical information by electronic transmission. I acknowledge that I have been given the opportunity to request an in-person assessment or other available alternative prior to the telemedicine visit and am voluntarily participating in the telemedicine visit.  I understand that I have the right to withhold or withdraw my consent to the use of telemedicine in the course of my care at any time, without affecting my right to future care or treatment, and that the Practitioner or I may terminate the telemedicine visit at any time. I understand that I have the right to inspect all information obtained and/or recorded in the course of the telemedicine visit and may receive copies of available information for a reasonable fee.  I understand that some of the potential risks of receiving the Services via telemedicine include:  Marland Kitchen Delay or interruption in medical evaluation due to technological equipment failure or disruption; . Information transmitted may not be sufficient (e.g. poor resolution of images) to allow  for appropriate medical decision making by the Practitioner; and/or  . In rare instances, security protocols could fail, causing a breach of personal health information.  Furthermore, I acknowledge that it is my responsibility to provide information about my medical history, conditions and care that is complete and accurate to the best of my ability. I acknowledge that Practitioner's advice, recommendations, and/or decision may be based on factors not within their control, such as incomplete or inaccurate data provided by me or distortions of diagnostic images or specimens that may result from electronic transmissions. I understand that the practice of medicine is not an exact science and that Practitioner makes no warranties or guarantees regarding treatment outcomes. I acknowledge that a copy of this consent can be made available to me via my patient portal [facility] MyChart), or I can request a printed copy by calling the office of [facility].    I understand that my insurance will be billed for this visit.   I have read or had this consent read to me. . I understand the contents of this consent, which adequately explains the benefits and risks of the Services being provided via telemedicine.  . I have been provided ample opportunity to ask questions regarding this consent and the Services and have had my questions answered to my satisfaction. . I give my informed consent for the services to be provided through the use of telemedicine in my medical care

## 2020-06-08 NOTE — Telephone Encounter (Signed)
Spoke with the pt and he reports that he was COVID positive starting on 05/25/20 however he is asking about when to have his Cath rescheduled but reports that he is not in a hurry due to continued symptoms... pt never had a fever but he is very congested, dry persistent cough.  I advised him that I will need to forward to the provider for their review and we will call him to make a plan.   Pt cancelled his post cath follow up with Georgie Chard NP for 06/14/20 and will reschedule after his cath. His next OV with Dr. Mayford Knife is 07/11/20.

## 2020-06-10 NOTE — H&P (View-Only) (Signed)
 Virtual Visit via Telephone Note   This visit type was conducted due to national recommendations for restrictions regarding the COVID-19 Pandemic (e.g. social distancing) in an effort to limit this patient's exposure and mitigate transmission in our community.  Due to his co-morbid illnesses, this patient is at least at moderate risk for complications without adequate follow up.  This format is felt to be most appropriate for this patient at this time.  The patient did not have access to video technology/had technical difficulties with video requiring transitioning to audio format only (telephone).  All issues noted in this document were discussed and addressed.  No physical exam could be performed with this format.  Please refer to the patient's chart for his  consent to telehealth for [facility].    Date:  06/14/2020   ID:  Luis Adams, DOB 12/30/1950, MRN [id number] The patient was identified using 2 identifiers.  Patient Location: Home Provider Location: Home Office  PCP:  Eason, Paul, MD  Cardiologist:  Traci Turner, MD  Electrophysiologist:  None   Evaluation Performed:  Follow-Up Visit  Chief Complaint:  Follow cath  History of Present Illness:    Luis Adams is a 70 y.o. male with a hx of paroxysmal atrial fibrillation on Xarelto, AAA (details unclear, followed by PCP), HTN, DM, RBBB, and habitual alcohol.Echocardiogram 07/22/18 showed EF 55-60%, mildly enlarged RV. Nuc 07/22/18 was normal, EF 53%. He reported dizziness with metoprolol (with normal HR and BP per report) therefore this was transitioned to diltiazem.  He was seen by Dr. Tuner 03/31/20 and reported chest pain which occurred under stress or during times of high emotions. Due to chest pain, coronary CTA 04/19/2020 was obtained which showed a coronary calcium score of 2413 which is 96 percentile for age and sex matched control. There was moderate atherosclerosis and significant blooming artifact in the mid and  distal LCx with plans for FFR flow analysis. This showed possible hemodynamically flow-limiting lesion in the proximal to mid RCA and distal LAD lesions with recommendations for cardiac catheterization.  He was then seen by myself in follow up for cath prep/consent however he was very reluctant and wished to follow with his PCP to discuss. He eventually decided to pursue LHC however pt and wife had been exposed to COVID and were symptomatic therefore initial cath was cancelled with plans to rescudule. Due to lapse of time since last pre-procedure assessment, he was seen today and reports no chest pain, SOB, palpitations, LE edema, or palpitations.   We reviewed holding Xarelto for 48 hours prior to cath and the need for repeat COVID and pre labs.   The patient does not have symptoms concerning for COVID-19 infection (fever, chills, cough, or new shortness of breath).   Past Medical History:  Diagnosis Date  . AAA (abdominal aortic aneurysm) (HCC)   . Arthritis   . Benign essential HTN 07/13/2018  . Habitual alcohol use   . Hyperlipidemia   . PAF (paroxysmal atrial fibrillation) (HCC) 07/13/2018  . Prediabetes   . RBBB    Past Surgical History:  Procedure Laterality Date  . TONSILLECTOMY       Current Meds  Medication Sig  . amLODipine (NORVASC) 5 MG tablet Take 1.5 tablets (7.5 mg total) by mouth daily.  . ascorbic acid (VITAMIN C) 500 MG tablet Take 500 mg by mouth daily.  . carvedilol (COREG) 6.25 MG tablet Take 1 tablet (6.25 mg total) by mouth 2 (two) times daily.  . nitroGLYCERIN (NITROSTAT) 0.4   MG SL tablet Place 1 tablet (0.4 mg total) under the tongue every 5 (five) minutes as needed for chest pain.  Marland Kitchen quinapril (ACCUPRIL) 40 MG tablet Take 1 tablet by mouth once daily (Patient taking differently: Take 40 mg by mouth daily.)  . sildenafil (REVATIO) 20 MG tablet Take 100 mg by mouth daily as needed (erectile dysfunction).   . Tetrahydrozoline HCl (VISINE OP) Apply 1 drop to eye  daily as needed (irritation/ dry eyes).  [name] 20 MG TABS tablet TAKE 1 TABLET BY MOUTH ONCE DAILY WITH SUPPER (Patient taking differently: Take 20 mg by mouth daily with supper.)     Allergies:   Patient has no known allergies.   Social History   Tobacco Use  . Smoking status: Never Smoker  . Smokeless tobacco: Never Used  Vaping Use  . Vaping Use: Never used  Substance Use Topics  . Alcohol use: Yes    Alcohol/week: 6.0 standard drinks    Types: 6 Cans of beer per week    Comment: reports drinking 1 can of beer daily  . Drug use: Never     Family Hx: The patient's family history includes Cancer in his maternal grandmother. He was adopted.  ROS:   Please see the history of present illness.     All other systems reviewed and are negative.  Prior CV studies:   The following studies were reviewed today:  Coronary CTA with FFR 05/08/2020:  FINDINGS: FFRct analysis was performed on the original cardiac CT angiogram dataset. Diagrammatic representation of the FFRct analysis is provided in a separate PDF document in PACS. This dictation was created using the PDF document and an interactive 3D model of the results. 3D model is not available in the EMR/PACS. Normal FFR range is >0.80.  1. Left Main: No significant stenosis. LM FFR = 0.99.  2. LAD: Possible stenosis of distal LAD. Proximal FFR = 0.91, Mid FFR = 0.85, Distal FFR = 0.72. Distal D1 FFR = 0.74.  3. LCX: No significant stenosis. Proximal FFR = 0.95, Mid LAD = 0.92, Mid FFR = 0.91.  4. OM1: No significant stenosis Proximal FFR = 0.93, Mid FFR = 0.93.  5. RCA: No significant stenosis. Proximal FFR = 0.99, Mid FFR = 0.78, Distal FFR = 0.61. PDA FFR = 0.54. RPLA 0.58.  IMPRESSION: 1. Coronary CT FFR flow analysis demonstrates a possible hemodynamically flow limiting lesion in the proximal to mid RCA and distal LAD.  2. Recommend cardiac catheterization.  Labs/Other Tests and Data Reviewed:     EKG:  No ECG reviewed.  Recent Labs: 03/31/2020: ALT 19; BUN 15; Creatinine, Ser 0.96; Potassium 4.4; Sodium 140   Recent Lipid Panel Lab Results  Component Value Date/Time   CHOL 206 (H) 03/31/2020 12:35 PM   TRIG 182 (H) 03/31/2020 12:35 PM   HDL 54 03/31/2020 12:35 PM   CHOLHDL 3.8 03/31/2020 12:35 PM   LDLCALC 120 (H) 03/31/2020 12:35 PM    Wt Readings from Last 3 Encounters:  06/14/20 186 lb (84.4 kg)  05/10/20 189 lb (85.7 kg)  03/31/20 192 lb 3.2 oz (87.2 kg)    Objective:    Vital Signs:  BP 124/72   Pulse 62   Temp 97.6 F (36.4 C)   Ht 6' (1.829 m)   Wt 186 lb (84.4 kg)   BMI 25.23 kg/m    VITAL SIGNS:  reviewed GEN:  no acute distress NEURO:  alert and oriented x 3, no obvious focal deficit PSYCH:  normal affect  ASSESSMENT & PLAN:    1. CAD per cCTA: -Reported stress induced chest pain and underwent cCTA. FFR flow analysis demonstrates a possible hemodynamically flow limiting lesion in the proximal to mid RCA and distal LAD. -Plan was for follow up to discuss LHC however patient is very reluctant to proceed and wished to discuss with his PCP>>>he then tested positive for COVID>>delaying his procedure further -Repeat BMET, CBC, COVID testing -Repeat EKG -No recent chest pain  2. PAF: -Denies any palpitations  -Continue carvedilol -Hold Xarelto 2 days prior to procedure   3. HTN: -Stable today -Continue current regimen   4. AAA: -Followed by PCP  5. DM2: -Followed by PCP -HbA1C 5.6% in June 2021 -diet controlled at present  6. HLD: -Last LDL, 120 on 03/31/20 -LDL goal <70 -Will need to add statin therapy however wishes to wait on this for now>>start post procedure    Shared Decision Making/Informed Consent The risks [stroke (1 in 1000), death (1 in 1000), kidney failure [usually temporary] (1 in 500), bleeding (1 in 200), allergic reaction [possibly serious] (1 in 200)], benefits (diagnostic support and management of coronary  artery disease) and alternatives of a cardiac catheterization were discussed in detail with Mr. Hornback and he is willing to proceed.  COVID-19 Education: The signs and symptoms of COVID-19 were discussed with the patient and how to seek care for testing (follow up with PCP or arrange E-visit).  The importance of social distancing was discussed today.  Time:   Today, I have spent 20 minutes with the patient with telehealth technology discussing the above problems.     Medication Adjustments/Labs and Tests Ordered: Current medicines are reviewed at length with the patient today.  Concerns regarding medicines are outlined above.   Tests Ordered: No orders of the defined types were placed in this encounter.   Medication Changes: No orders of the defined types were placed in this encounter.   Follow Up:  In Person 2 weeks post cath   Signed, Georgie Chard, NP  06/14/2020 2:48 PM    [facility]

## 2020-06-10 NOTE — Progress Notes (Signed)
 Virtual Visit via Telephone Note   This visit type was conducted due to national recommendations for restrictions regarding the COVID-19 Pandemic (e.g. social distancing) in an effort to limit this patient's exposure and mitigate transmission in our community.  Due to his co-morbid illnesses, this patient is at least at moderate risk for complications without adequate follow up.  This format is felt to be most appropriate for this patient at this time.  The patient did not have access to video technology/had technical difficulties with video requiring transitioning to audio format only (telephone).  All issues noted in this document were discussed and addressed.  No physical exam could be performed with this format.  Please refer to the patient's chart for his  consent to telehealth for [facility].    Date:  06/14/2020   ID:  Luis Adams, DOB 09/12/1950, MRN [id number] The patient was identified using 2 identifiers.  Patient Location: Home Provider Location: Home Office  PCP:  Eason, Paul, MD  Cardiologist:  Traci Turner, MD  Electrophysiologist:  None   Evaluation Performed:  Follow-Up Visit  Chief Complaint:  Follow cath  History of Present Illness:    Luis Adams is a 69 y.o. male with a hx of paroxysmal atrial fibrillation on Xarelto, AAA (details unclear, followed by PCP), HTN, DM, RBBB, and habitual alcohol.Echocardiogram 07/22/18 showed EF 55-60%, mildly enlarged RV. Nuc 07/22/18 was normal, EF 53%. He reported dizziness with metoprolol (with normal HR and BP per report) therefore this was transitioned to diltiazem.  He was seen by Dr. Tuner 03/31/20 and reported chest pain which occurred under stress or during times of high emotions. Due to chest pain, coronary CTA 04/19/2020 was obtained which showed a coronary calcium score of 2413 which is 96 percentile for age and sex matched control. There was moderate atherosclerosis and significant blooming artifact in the mid and  distal LCx with plans for FFR flow analysis. This showed possible hemodynamically flow-limiting lesion in the proximal to mid RCA and distal LAD lesions with recommendations for cardiac catheterization.  He was then seen by myself in follow up for cath prep/consent however he was very reluctant and wished to follow with his PCP to discuss. He eventually decided to pursue LHC however pt and wife had been exposed to COVID and were symptomatic therefore initial cath was cancelled with plans to rescudule. Due to lapse of time since last pre-procedure assessment, he was seen today and reports no chest pain, SOB, palpitations, LE edema, or palpitations.   We reviewed holding Xarelto for 48 hours prior to cath and the need for repeat COVID and pre labs.   The patient does not have symptoms concerning for COVID-19 infection (fever, chills, cough, or new shortness of breath).   Past Medical History:  Diagnosis Date  . AAA (abdominal aortic aneurysm) (HCC)   . Arthritis   . Benign essential HTN 07/13/2018  . Habitual alcohol use   . Hyperlipidemia   . PAF (paroxysmal atrial fibrillation) (HCC) 07/13/2018  . Prediabetes   . RBBB    Past Surgical History:  Procedure Laterality Date  . TONSILLECTOMY       Current Meds  Medication Sig  . amLODipine (NORVASC) 5 MG tablet Take 1.5 tablets (7.5 mg total) by mouth daily.  . ascorbic acid (VITAMIN C) 500 MG tablet Take 500 mg by mouth daily.  . carvedilol (COREG) 6.25 MG tablet Take 1 tablet (6.25 mg total) by mouth 2 (two) times daily.  . nitroGLYCERIN (NITROSTAT) 0.4   MG SL tablet Place 1 tablet (0.4 mg total) under the tongue every 5 (five) minutes as needed for chest pain.  Marland Kitchen quinapril (ACCUPRIL) 40 MG tablet Take 1 tablet by mouth once daily (Patient taking differently: Take 40 mg by mouth daily.)  . sildenafil (REVATIO) 20 MG tablet Take 100 mg by mouth daily as needed (erectile dysfunction).   . Tetrahydrozoline HCl (VISINE OP) Apply 1 drop to eye  daily as needed (irritation/ dry eyes).  [name] 20 MG TABS tablet TAKE 1 TABLET BY MOUTH ONCE DAILY WITH SUPPER (Patient taking differently: Take 20 mg by mouth daily with supper.)     Allergies:   Patient has no known allergies.   Social History   Tobacco Use  . Smoking status: Never Smoker  . Smokeless tobacco: Never Used  Vaping Use  . Vaping Use: Never used  Substance Use Topics  . Alcohol use: Yes    Alcohol/week: 6.0 standard drinks    Types: 6 Cans of beer per week    Comment: reports drinking 1 can of beer daily  . Drug use: Never     Family Hx: The patient's family history includes Cancer in his maternal grandmother. He was adopted.  ROS:   Please see the history of present illness.     All other systems reviewed and are negative.  Prior CV studies:   The following studies were reviewed today:  Coronary CTA with FFR 05/08/2020:  FINDINGS: FFRct analysis was performed on the original cardiac CT angiogram dataset. Diagrammatic representation of the FFRct analysis is provided in a separate PDF document in PACS. This dictation was created using the PDF document and an interactive 3D model of the results. 3D model is not available in the EMR/PACS. Normal FFR range is >0.80.  1. Left Main: No significant stenosis. LM FFR = 0.99.  2. LAD: Possible stenosis of distal LAD. Proximal FFR = 0.91, Mid FFR = 0.85, Distal FFR = 0.72. Distal D1 FFR = 0.74.  3. LCX: No significant stenosis. Proximal FFR = 0.95, Mid LAD = 0.92, Mid FFR = 0.91.  4. OM1: No significant stenosis Proximal FFR = 0.93, Mid FFR = 0.93.  5. RCA: No significant stenosis. Proximal FFR = 0.99, Mid FFR = 0.78, Distal FFR = 0.61. PDA FFR = 0.54. RPLA 0.58.  IMPRESSION: 1. Coronary CT FFR flow analysis demonstrates a possible hemodynamically flow limiting lesion in the proximal to mid RCA and distal LAD.  2. Recommend cardiac catheterization.  Labs/Other Tests and Data Reviewed:     EKG:  No ECG reviewed.  Recent Labs: 03/31/2020: ALT 19; BUN 15; Creatinine, Ser 0.96; Potassium 4.4; Sodium 140   Recent Lipid Panel Lab Results  Component Value Date/Time   CHOL 206 (H) 03/31/2020 12:35 PM   TRIG 182 (H) 03/31/2020 12:35 PM   HDL 54 03/31/2020 12:35 PM   CHOLHDL 3.8 03/31/2020 12:35 PM   LDLCALC 120 (H) 03/31/2020 12:35 PM    Wt Readings from Last 3 Encounters:  06/14/20 186 lb (84.4 kg)  05/10/20 189 lb (85.7 kg)  03/31/20 192 lb 3.2 oz (87.2 kg)    Objective:    Vital Signs:  BP 124/72   Pulse 62   Temp 97.6 F (36.4 C)   Ht 6' (1.829 m)   Wt 186 lb (84.4 kg)   BMI 25.23 kg/m    VITAL SIGNS:  reviewed GEN:  no acute distress NEURO:  alert and oriented x 3, no obvious focal deficit PSYCH:  normal affect  ASSESSMENT & PLAN:    1. CAD per cCTA: -Reported stress induced chest pain and underwent cCTA. FFR flow analysis demonstrates a possible hemodynamically flow limiting lesion in the proximal to mid RCA and distal LAD. -Plan was for follow up to discuss LHC however patient is very reluctant to proceed and wished to discuss with his PCP>>>he then tested positive for COVID>>delaying his procedure further -Repeat BMET, CBC, COVID testing -Repeat EKG -No recent chest pain  2. PAF: -Denies any palpitations  -Continue carvedilol -Hold Xarelto 2 days prior to procedure   3. HTN: -Stable today -Continue current regimen   4. AAA: -Followed by PCP  5. DM2: -Followed by PCP -HbA1C 5.6% in June 2021 -diet controlled at present  6. HLD: -Last LDL, 120 on 03/31/20 -LDL goal <70 -Will need to add statin therapy however wishes to wait on this for now>>start post procedure    Shared Decision Making/Informed Consent The risks [stroke (1 in 1000), death (1 in 1000), kidney failure [usually temporary] (1 in 500), bleeding (1 in 200), allergic reaction [possibly serious] (1 in 200)], benefits (diagnostic support and management of coronary  artery disease) and alternatives of a cardiac catheterization were discussed in detail with Mr. Hornback and he is willing to proceed.  COVID-19 Education: The signs and symptoms of COVID-19 were discussed with the patient and how to seek care for testing (follow up with PCP or arrange E-visit).  The importance of social distancing was discussed today.  Time:   Today, I have spent 20 minutes with the patient with telehealth technology discussing the above problems.     Medication Adjustments/Labs and Tests Ordered: Current medicines are reviewed at length with the patient today.  Concerns regarding medicines are outlined above.   Tests Ordered: No orders of the defined types were placed in this encounter.   Medication Changes: No orders of the defined types were placed in this encounter.   Follow Up:  In Person 2 weeks post cath   Signed, Georgie Chard, NP  06/14/2020 2:48 PM    [facility]

## 2020-06-10 NOTE — H&P (View-Only) (Signed)
Virtual Visit via Telephone Note   This visit type was conducted due to national recommendations for restrictions regarding the COVID-19 Pandemic (e.g. social distancing) in an effort to limit this patient's exposure and mitigate transmission in our community.  Due to his co-morbid illnesses, this patient is at least at moderate risk for complications without adequate follow up.  This format is felt to be most appropriate for this patient at this time.  The patient did not have access to video technology/had technical difficulties with video requiring transitioning to audio format only (telephone).  All issues noted in this document were discussed and addressed.  No physical exam could be performed with this format.  Please refer to the patient's chart for his  consent to telehealth for [facility].    Date:  06/14/2020   ID:  Luis Adams, DOB [birth year], MRN [id number] The patient was identified using 2 identifiers.  Patient Location: Home Provider Location: Home Office  PCP:  Kathlee Nations, MD  Cardiologist:  Armanda Magic, MD  Electrophysiologist:  None   Evaluation Performed:  Follow-Up Visit  Chief Complaint:  Follow cath  History of Present Illness:    Luis Adams is a 70 y.o. male with a hx of paroxysmal atrial fibrillation on Xarelto, AAA (details unclear, followed by PCP), HTN, DM, RBBB, and habitual alcohol.Echocardiogram 07/22/18 showed EF 55-60%, mildly enlarged RV. Nuc 07/22/18 was normal, EF 53%. He reported dizziness with metoprolol (with normal HR and BP per report) therefore this was transitioned to diltiazem.  He was seen by Dr. Mendel Corning 03/31/20 and reported chest pain which occurred under stress or during times of high emotions. Due to chest pain, coronary CTA 04/19/2020 was obtained which showed a coronary calcium score of 2413 which is 96 percentile for age and sex matched control. There was moderate atherosclerosis and significant blooming artifact in the mid and  distal LCx with plans for FFR flow analysis. This showed possible hemodynamically flow-limiting lesion in the proximal to mid RCA and distal LAD lesions with recommendations for cardiac catheterization.  He was then seen by myself in follow up for cath prep/consent however he was very reluctant and wished to follow with his PCP to discuss. He eventually decided to pursue LHC however pt and wife had been exposed to COVID and were symptomatic therefore initial cath was cancelled with plans to rescudule. Due to lapse of time since last pre-procedure assessment, he was seen today and reports no chest pain, SOB, palpitations, LE edema, or palpitations.   We reviewed holding Xarelto for 48 hours prior to cath and the need for repeat COVID and pre labs.   The patient does not have symptoms concerning for COVID-19 infection (fever, chills, cough, or new shortness of breath).   Past Medical History:  Diagnosis Date  . AAA (abdominal aortic aneurysm) (HCC)   . Arthritis   . Benign essential HTN 07/13/2018  . Habitual alcohol use   . Hyperlipidemia   . PAF (paroxysmal atrial fibrillation) (HCC) 07/13/2018  . Prediabetes   . RBBB    Past Surgical History:  Procedure Laterality Date  . TONSILLECTOMY       Current Meds  Medication Sig  . amLODipine (NORVASC) 5 MG tablet Take 1.5 tablets (7.5 mg total) by mouth daily.  Marland Kitchen ascorbic acid (VITAMIN C) 500 MG tablet Take 500 mg by mouth daily.  . carvedilol (COREG) 6.25 MG tablet Take 1 tablet (6.25 mg total) by mouth 2 (two) times daily.  . nitroGLYCERIN (NITROSTAT) 0.4  MG SL tablet Place 1 tablet (0.4 mg total) under the tongue every 5 (five) minutes as needed for chest pain.  Marland Kitchen quinapril (ACCUPRIL) 40 MG tablet Take 1 tablet by mouth once daily (Patient taking differently: Take 40 mg by mouth daily.)  . sildenafil (REVATIO) 20 MG tablet Take 100 mg by mouth daily as needed (erectile dysfunction).   . Tetrahydrozoline HCl (VISINE OP) Apply 1 drop to eye  daily as needed (irritation/ dry eyes).  [name] 20 MG TABS tablet TAKE 1 TABLET BY MOUTH ONCE DAILY WITH SUPPER (Patient taking differently: Take 20 mg by mouth daily with supper.)     Allergies:   Patient has no known allergies.   Social History   Tobacco Use  . Smoking status: Never Smoker  . Smokeless tobacco: Never Used  Vaping Use  . Vaping Use: Never used  Substance Use Topics  . Alcohol use: Yes    Alcohol/week: 6.0 standard drinks    Types: 6 Cans of beer per week    Comment: reports drinking 1 can of beer daily  . Drug use: Never     Family Hx: The patient's family history includes Cancer in his maternal grandmother. He was adopted.  ROS:   Please see the history of present illness.     All other systems reviewed and are negative.  Prior CV studies:   The following studies were reviewed today:  Coronary CTA with FFR 05/08/2020:  FINDINGS: FFRct analysis was performed on the original cardiac CT angiogram dataset. Diagrammatic representation of the FFRct analysis is provided in a separate PDF document in PACS. This dictation was created using the PDF document and an interactive 3D model of the results. 3D model is not available in the EMR/PACS. Normal FFR range is >0.80.  1. Left Main: No significant stenosis. LM FFR = 0.99.  2. LAD: Possible stenosis of distal LAD. Proximal FFR = 0.91, Mid FFR = 0.85, Distal FFR = 0.72. Distal D1 FFR = 0.74.  3. LCX: No significant stenosis. Proximal FFR = 0.95, Mid LAD = 0.92, Mid FFR = 0.91.  4. OM1: No significant stenosis Proximal FFR = 0.93, Mid FFR = 0.93.  5. RCA: No significant stenosis. Proximal FFR = 0.99, Mid FFR = 0.78, Distal FFR = 0.61. PDA FFR = 0.54. RPLA 0.58.  IMPRESSION: 1. Coronary CT FFR flow analysis demonstrates a possible hemodynamically flow limiting lesion in the proximal to mid RCA and distal LAD.  2. Recommend cardiac catheterization.  Labs/Other Tests and Data Reviewed:     EKG:  No ECG reviewed.  Recent Labs: 03/31/2020: ALT 19; BUN 15; Creatinine, Ser 0.96; Potassium 4.4; Sodium 140   Recent Lipid Panel Lab Results  Component Value Date/Time   CHOL 206 (H) 03/31/2020 12:35 PM   TRIG 182 (H) 03/31/2020 12:35 PM   HDL 54 03/31/2020 12:35 PM   CHOLHDL 3.8 03/31/2020 12:35 PM   LDLCALC 120 (H) 03/31/2020 12:35 PM    Wt Readings from Last 3 Encounters:  06/14/20 186 lb (84.4 kg)  05/10/20 189 lb (85.7 kg)  03/31/20 192 lb 3.2 oz (87.2 kg)    Objective:    Vital Signs:  BP 124/72   Pulse 62   Temp 97.6 F (36.4 C)   Ht 6' (1.829 m)   Wt 186 lb (84.4 kg)   BMI 25.23 kg/m    VITAL SIGNS:  reviewed GEN:  no acute distress NEURO:  alert and oriented x 3, no obvious focal deficit PSYCH:  normal affect  ASSESSMENT & PLAN:    1. CAD per cCTA: -Reported stress induced chest pain and underwent cCTA. FFR flow analysis demonstrates a possible hemodynamically flow limiting lesion in the proximal to mid RCA and distal LAD. -Plan was for follow up to discuss LHC however patient is very reluctant to proceed and wished to discuss with his PCP>>>he then tested positive for COVID>>delaying his procedure further -Repeat BMET, CBC, COVID testing -Repeat EKG -No recent chest pain  2. PAF: -Denies any palpitations  -Continue carvedilol -Hold Xarelto 2 days prior to procedure   3. HTN: -Stable today -Continue current regimen   4. AAA: -Followed by PCP  5. DM2: -Followed by PCP -HbA1C 5.6% in June 2021 -diet controlled at present  6. HLD: -Last LDL, 120 on 03/31/20 -LDL goal <70 -Will need to add statin therapy however wishes to wait on this for now>>start post procedure    Shared Decision Making/Informed Consent The risks [stroke (1 in 1000), death (1 in 1000), kidney failure [usually temporary] (1 in 500), bleeding (1 in 200), allergic reaction [possibly serious] (1 in 200)], benefits (diagnostic support and management of coronary  artery disease) and alternatives of a cardiac catheterization were discussed in detail with Mr. Hornback and he is willing to proceed.  COVID-19 Education: The signs and symptoms of COVID-19 were discussed with the patient and how to seek care for testing (follow up with PCP or arrange E-visit).  The importance of social distancing was discussed today.  Time:   Today, I have spent 20 minutes with the patient with telehealth technology discussing the above problems.     Medication Adjustments/Labs and Tests Ordered: Current medicines are reviewed at length with the patient today.  Concerns regarding medicines are outlined above.   Tests Ordered: No orders of the defined types were placed in this encounter.   Medication Changes: No orders of the defined types were placed in this encounter.   Follow Up:  In Person 2 weeks post cath   Signed, Georgie Chard, NP  06/14/2020 2:48 PM    [facility]

## 2020-06-14 ENCOUNTER — Telehealth (INDEPENDENT_AMBULATORY_CARE_PROVIDER_SITE_OTHER): Payer: Medicare Other | Admitting: Cardiology

## 2020-06-14 ENCOUNTER — Other Ambulatory Visit: Payer: Self-pay

## 2020-06-14 ENCOUNTER — Encounter: Payer: Self-pay | Admitting: Cardiology

## 2020-06-14 ENCOUNTER — Ambulatory Visit: Payer: Medicare Other | Admitting: Cardiology

## 2020-06-14 VITALS — BP 124/72 | HR 62 | Temp 97.6°F | Ht 72.0 in | Wt 186.0 lb

## 2020-06-14 DIAGNOSIS — R931 Abnormal findings on diagnostic imaging of heart and coronary circulation: Secondary | ICD-10-CM | POA: Diagnosis not present

## 2020-06-14 DIAGNOSIS — E1100 Type 2 diabetes mellitus with hyperosmolarity without nonketotic hyperglycemic-hyperosmolar coma (NKHHC): Secondary | ICD-10-CM | POA: Diagnosis not present

## 2020-06-14 DIAGNOSIS — E785 Hyperlipidemia, unspecified: Secondary | ICD-10-CM

## 2020-06-14 DIAGNOSIS — I480 Paroxysmal atrial fibrillation: Secondary | ICD-10-CM

## 2020-06-14 DIAGNOSIS — I10 Essential (primary) hypertension: Secondary | ICD-10-CM | POA: Diagnosis not present

## 2020-06-14 NOTE — Patient Instructions (Addendum)
Medication Instructions:  Your physician recommends that you continue on your current medications as directed. Please refer to the Current Medication list given to you today.  *If you need a refill on your cardiac medications before your next appointment, please call your pharmacy*   Lab Work: TO BE DONE on Wednesday, February 26 : BMET ,CBC If you have labs (blood work) drawn today and your tests are completely normal, you will receive your results only by: Marland Kitchen MyChart Message (if you have MyChart) OR . A paper copy in the mail If you have any lab test that is abnormal or we need to change your treatment, we will call you to review the results.   Testing/Procedures: Your physician has requested that you have a cardiac catheterization. Cardiac catheterization is used to diagnose and/or treat various heart conditions. Doctors may recommend this procedure for a number of different reasons. The most common reason is to evaluate chest pain. Chest pain can be a symptom of coronary artery disease (CAD), and cardiac catheterization can show whether plaque is narrowing or blocking your heart's arteries. This procedure is also used to evaluate the valves, as well as measure the blood flow and oxygen levels in different parts of your heart. For further information please visit [email or web address] Please follow instruction sheet, as given.  Follow-Up: At [facility], you and your health needs are our priority.  As part of our continuing mission to provide you with exceptional heart care, we have created designated Provider Care Teams.  These Care Teams include your primary Cardiologist (physician) and Advanced Practice Providers (APPs -  Physician Assistants and Nurse Practitioners) who all work together to provide you with the care you need, when you need it.  We recommend signing up for the patient portal called "MyChart".  Sign up information is provided on this After Visit Summary.  MyChart is used to  connect with patients for Virtual Visits (Telemedicine).  Patients are able to view lab/test results, encounter notes, upcoming appointments, etc.  Non-urgent messages can be sent to your provider as well.   To learn more about what you can do with MyChart, go to [email or web address].    Your next appointment:   Tuesday, February 15   The format for your next appointment:   In Person  Provider:   Armanda Magic, MD   YOU HAVE A NURSE VISIT TO HAVE A EKG DONE ON Wednesday, January 26   Other Instructions    [facility] CARDIOVASCULAR DIVISION [facility] [address] OFFICE [address] [place] [employer] [place] [address] Dept: [phone] Loc: [phone]  Tlaloc Taddei  06/14/2020  You are scheduled for a Cardiac Catheterization on Friday, January 28 with Dr. Bryan Lemma.  1. Please arrive at the [facility] (Main Entrance A) at [facility]: [address] [place], [place] [address] at 8:30 AM (This time is two hours before your procedure to ensure your preparation). Free valet parking service is available.   Special note: Every effort is made to have your procedure done on time. Please understand that emergencies sometimes delay scheduled procedures.  2. Diet: Do not eat solid foods after midnight.  The patient may have clear liquids until 5am upon the day of the procedure.  3. Labs: You will need to have blood drawn on Wednesday, January 26 at [facility]. [address], [place]  Open: 7:30am - 5pm    Phone: [phone]. You do not need  to be fasting.  4. Medication instructions in preparation for your procedure:   Contrast Allergy: No  Stop taking Xarelto (Rivaroxaban) on Wednesday, January 26.  Stop taking, Quinapril (Acupril) Thursday, January 27,  On the morning of your procedure, take your Aspirin and any morning medicines NOT listed above.  You may use sips of water.  5. Plan for one  night stay--bring personal belongings. 6. Bring a current list of your medications and current insurance cards. 7. You MUST have a responsible person to drive you home. 8. Someone MUST be with you the first 24 hours after you arrive home or your discharge will be delayed. 9. Please wear clothes that are easy to get on and off and wear slip-on shoes.  Thank you for allowing [place] to care for you!   -- [facility]  Due to recent COVID-19 restrictions implemented by our local and state authorities and in an effort to keep both patients and staff as safe as possible, our hospital system requires COVID-19 testing prior to certain scheduled hospital procedures.  Please go to [address] [facility] [place], [place] [address] on Wednesday, January 26 at 12 pm  .  This is a drive up testing site.  You will not need to exit your vehicle.  You will not be billed at the time of testing but may receive a bill later depending on your insurance. You must agree to self-quarantine from the time of your testing until the procedure date on Friday, January, 28.  This should included staying home with ONLY the people you live with.  Avoid take-out, grocery store shopping or leaving the house for any non-emergent reason.  Failure to have your COVID-19 test done on the date and time you have been scheduled will result in cancellation of your procedure.  Please call our office at [phone] if you have any questions.

## 2020-06-21 ENCOUNTER — Telehealth: Payer: Self-pay | Admitting: Cardiology

## 2020-06-21 ENCOUNTER — Other Ambulatory Visit: Payer: Medicare Other | Admitting: *Deleted

## 2020-06-21 ENCOUNTER — Ambulatory Visit (INDEPENDENT_AMBULATORY_CARE_PROVIDER_SITE_OTHER): Payer: Medicare Other

## 2020-06-21 ENCOUNTER — Other Ambulatory Visit (HOSPITAL_COMMUNITY)
Admission: RE | Admit: 2020-06-21 | Discharge: 2020-06-21 | Disposition: A | Discharge status: Home / Self Care | Payer: Medicare Other | Source: Ambulatory Visit | Attending: Cardiology | Admitting: Cardiology

## 2020-06-21 ENCOUNTER — Other Ambulatory Visit: Payer: Self-pay

## 2020-06-21 DIAGNOSIS — Z01812 Encounter for preprocedural laboratory examination: Secondary | ICD-10-CM | POA: Insufficient documentation

## 2020-06-21 DIAGNOSIS — I480 Paroxysmal atrial fibrillation: Secondary | ICD-10-CM

## 2020-06-21 DIAGNOSIS — Z20822 Contact with and (suspected) exposure to covid-19: Secondary | ICD-10-CM | POA: Insufficient documentation

## 2020-06-21 DIAGNOSIS — I10 Essential (primary) hypertension: Secondary | ICD-10-CM

## 2020-06-21 DIAGNOSIS — E785 Hyperlipidemia, unspecified: Secondary | ICD-10-CM

## 2020-06-21 DIAGNOSIS — R931 Abnormal findings on diagnostic imaging of heart and coronary circulation: Secondary | ICD-10-CM

## 2020-06-21 DIAGNOSIS — R079 Chest pain, unspecified: Secondary | ICD-10-CM

## 2020-06-21 DIAGNOSIS — I208 Other forms of angina pectoris: Secondary | ICD-10-CM | POA: Insufficient documentation

## 2020-06-21 DIAGNOSIS — E1100 Type 2 diabetes mellitus with hyperosmolarity without nonketotic hyperglycemic-hyperosmolar coma (NKHHC): Secondary | ICD-10-CM

## 2020-06-21 LAB — SARS CORONAVIRUS 2 (TAT 6-24 HRS): SARS Coronavirus 2: NEGATIVE

## 2020-06-21 NOTE — Addendum Note (Signed)
Addended by: Roney Mans A on: 06/21/2020 11:09 AM   Modules accepted: Level of Service

## 2020-06-21 NOTE — Telephone Encounter (Signed)
Pt c/o medication issue:  1. Name of Medication: quinapril (ACCUPRIL) 40 MG tablet    2. How are you currently taking this medication (dosage and times per day)? As prescribed.  3. Are you having a reaction (difficulty breathing--STAT)? No.  4. What is your medication issue? Patient states that he has a procedure on 06/23/2020 and wants to know if he should still take this medication in preparation of his procedure. Please advise.

## 2020-06-21 NOTE — Progress Notes (Signed)
1.) Reason for visit: EKG prior to cardiac cath  2.) Name of MD requesting visit: Kaycee Mcgaugh  3.) H&P: Pt with abnormal cardiac CT scheduled for cath  4.) ROS related to problem: pre EKG  5.) Assessment and plan per MD: Reviewed by DOD.  Ok to proceed with cath  Advised Pt ok to continue quinapril prior to cath.  Kidney function is normal.

## 2020-06-21 NOTE — Telephone Encounter (Signed)
Spoke with the patient and advised him that he can take his quinapril 1/27 prior to his procedure. Patient had his pre-procedure lab work today and I advised him if his lab work indicated that anything needed to be done differently based on those results then we would be in touch with him.  Patient is aware to hold xarelto starting today.

## 2020-06-22 ENCOUNTER — Telehealth: Payer: Self-pay | Admitting: *Deleted

## 2020-06-22 LAB — BASIC METABOLIC PANEL
BUN/Creatinine Ratio: 15 (ref 10–24)
BUN: 15 mg/dL (ref 8–27)
CO2: 22 mmol/L (ref 20–29)
Calcium: 9.5 mg/dL (ref 8.6–10.2)
Chloride: 104 mmol/L (ref 96–106)
Creatinine, Ser: 0.98 mg/dL (ref 0.76–1.27)
GFR calc Af Amer: 91 mL/min/{1.73_m2} (ref >59)
GFR calc non Af Amer: 79 mL/min/{1.73_m2} (ref >59)
Glucose: 95 mg/dL (ref 65–99)
Potassium: 4.4 mmol/L (ref 3.5–5.2)
Sodium: 139 mmol/L (ref 134–144)

## 2020-06-22 LAB — CBC
Hematocrit: 39.6 % (ref 37.5–51.0)
Hemoglobin: 13.3 g/dL (ref 13.0–17.7)
MCH: 30.5 pg (ref 26.6–33.0)
MCHC: 33.6 g/dL (ref 31.5–35.7)
MCV: 91 fL (ref 79–97)
Platelets: 230 10*3/uL (ref 150–450)
RBC: 4.36 x10E6/uL (ref 4.14–5.80)
RDW: 12.9 % (ref 11.6–15.4)
WBC: 8.2 10*3/uL (ref 3.4–10.8)

## 2020-06-22 NOTE — Telephone Encounter (Signed)
Pt contacted pre-catheterization scheduled at [facility] for: Friday June 23, 2020 10:30 AM Verified arrival time and place: [facility] Main Entrance A [facility]) at: 8:30 AM   No solid food after midnight prior to cath, clear liquids until 5 AM day of procedure.  Hold: Xarelto-none 06/21/20 until post procedure Sildenafil-until post procedure  Except hold medications AM meds can be  taken pre-cath with sips of water including: ASA 81 mg   Confirmed patient has responsible adult to drive home post procedure and be with patient first 24 hours after arriving home: yes  You are allowed ONE visitor in the waiting room during the time you are at the hospital for your procedure. Both you and your visitor must wear a mask once you enter the hospital.  Reviewed procedure/mask/visitor instructions with patient.

## 2020-06-23 ENCOUNTER — Other Ambulatory Visit: Payer: Self-pay

## 2020-06-23 ENCOUNTER — Ambulatory Visit (HOSPITAL_COMMUNITY)
Admission: RE | Disposition: A | Discharge status: Home / Self Care | Payer: Self-pay | Source: Home / Self Care | Attending: Cardiology

## 2020-06-23 ENCOUNTER — Ambulatory Visit (HOSPITAL_COMMUNITY)
Admission: RE | Admit: 2020-06-23 | Discharge: 2020-06-23 | Disposition: A | Discharge status: Home / Self Care | Payer: Medicare Other | Attending: Cardiology | Admitting: Cardiology

## 2020-06-23 DIAGNOSIS — E785 Hyperlipidemia, unspecified: Secondary | ICD-10-CM | POA: Diagnosis not present

## 2020-06-23 DIAGNOSIS — E119 Type 2 diabetes mellitus without complications: Secondary | ICD-10-CM | POA: Insufficient documentation

## 2020-06-23 DIAGNOSIS — I25119 Atherosclerotic heart disease of native coronary artery with unspecified angina pectoris: Secondary | ICD-10-CM | POA: Diagnosis present

## 2020-06-23 DIAGNOSIS — Z7901 Long term (current) use of anticoagulants: Secondary | ICD-10-CM | POA: Diagnosis not present

## 2020-06-23 DIAGNOSIS — I25118 Atherosclerotic heart disease of native coronary artery with other forms of angina pectoris: Secondary | ICD-10-CM

## 2020-06-23 DIAGNOSIS — I714 Abdominal aortic aneurysm, without rupture: Secondary | ICD-10-CM | POA: Insufficient documentation

## 2020-06-23 DIAGNOSIS — I10 Essential (primary) hypertension: Secondary | ICD-10-CM | POA: Insufficient documentation

## 2020-06-23 DIAGNOSIS — I480 Paroxysmal atrial fibrillation: Secondary | ICD-10-CM | POA: Insufficient documentation

## 2020-06-23 DIAGNOSIS — I4891 Unspecified atrial fibrillation: Secondary | ICD-10-CM | POA: Diagnosis present

## 2020-06-23 DIAGNOSIS — Z8616 Personal history of COVID-19: Secondary | ICD-10-CM | POA: Insufficient documentation

## 2020-06-23 DIAGNOSIS — I208 Other forms of angina pectoris: Secondary | ICD-10-CM | POA: Diagnosis present

## 2020-06-23 DIAGNOSIS — Z79899 Other long term (current) drug therapy: Secondary | ICD-10-CM | POA: Insufficient documentation

## 2020-06-23 DIAGNOSIS — R931 Abnormal findings on diagnostic imaging of heart and coronary circulation: Secondary | ICD-10-CM | POA: Diagnosis present

## 2020-06-23 HISTORY — PX: LEFT HEART CATH AND CORONARY ANGIOGRAPHY: CATH118249

## 2020-06-23 HISTORY — PX: INTRAVASCULAR ULTRASOUND/IVUS: CATH118244

## 2020-06-23 LAB — POCT ACTIVATED CLOTTING TIME: Activated Clotting Time: 267 seconds

## 2020-06-23 SURGERY — LEFT HEART CATH AND CORONARY ANGIOGRAPHY
Anesthesia: LOCAL

## 2020-06-23 MED ORDER — SODIUM CHLORIDE 0.9% FLUSH: 3.0000 mL | Freq: Two times a day (BID) | INTRAVENOUS | Status: DC

## 2020-06-23 MED ORDER — IOHEXOL 350 MG/ML SOLN
INTRAVENOUS | Status: DC | PRN
  Administered 2020-06-23: 80 mL via INTRA_ARTERIAL

## 2020-06-23 MED ORDER — HEPARIN (PORCINE) IN NACL 1000-0.9 UT/500ML-% IV SOLN
INTRAVENOUS | Status: AC
  Filled 2020-06-23: qty 500

## 2020-06-23 MED ORDER — MIDAZOLAM HCL 2 MG/2ML IJ SOLN
INTRAMUSCULAR | Status: AC
  Filled 2020-06-23: qty 2

## 2020-06-23 MED ORDER — FENTANYL CITRATE (PF) 100 MCG/2ML IJ SOLN
INTRAMUSCULAR | Status: AC
  Filled 2020-06-23: qty 2

## 2020-06-23 MED ORDER — SODIUM CHLORIDE 0.9 % WEIGHT BASED INFUSION
3.0000 mL/kg/h | INTRAVENOUS | Status: AC
  Administered 2020-06-23: 3 mL/kg/h via INTRAVENOUS

## 2020-06-23 MED ORDER — HYDRALAZINE HCL 20 MG/ML IJ SOLN: 10.0000 mg | INTRAMUSCULAR | Status: DC | PRN

## 2020-06-23 MED ORDER — SODIUM CHLORIDE 0.9 % IV SOLN: 250.0000 mL | INTRAVENOUS | Status: DC | PRN

## 2020-06-23 MED ORDER — CLOPIDOGREL BISULFATE 75 MG PO TABS: 75.0000 mg | ORAL_TABLET | Freq: Every day | ORAL | 11 refills | Status: DC

## 2020-06-23 MED ORDER — VERAPAMIL HCL 2.5 MG/ML IV SOLN
INTRAVENOUS | Status: DC | PRN
  Administered 2020-06-23: 10 mL via INTRA_ARTERIAL

## 2020-06-23 MED ORDER — HEPARIN (PORCINE) IN NACL 1000-0.9 UT/500ML-% IV SOLN
INTRAVENOUS | Status: DC | PRN
  Administered 2020-06-23 (×2): 500 mL

## 2020-06-23 MED ORDER — LABETALOL HCL 5 MG/ML IV SOLN
10.0000 mg | INTRAVENOUS | Status: DC | PRN
Start: 2020-06-23 — End: 2020-06-23

## 2020-06-23 MED ORDER — HEPARIN SODIUM (PORCINE) 1000 UNIT/ML IJ SOLN
INTRAMUSCULAR | Status: DC | PRN
  Administered 2020-06-23 (×2): 4500 [IU] via INTRAVENOUS

## 2020-06-23 MED ORDER — SODIUM CHLORIDE 0.9% FLUSH: 3.0000 mL | INTRAVENOUS | Status: DC | PRN

## 2020-06-23 MED ORDER — SODIUM CHLORIDE 0.9 % IV SOLN: INTRAVENOUS | Status: DC

## 2020-06-23 MED ORDER — ASPIRIN 81 MG PO CHEW: 81.0000 mg | CHEWABLE_TABLET | ORAL | Status: DC

## 2020-06-23 MED ORDER — SODIUM CHLORIDE 0.9 % WEIGHT BASED INFUSION: 1.0000 mL/kg/h | INTRAVENOUS | Status: DC

## 2020-06-23 MED ORDER — LIDOCAINE HCL (PF) 1 % IJ SOLN
INTRAMUSCULAR | Status: DC | PRN
  Administered 2020-06-23: 2 mL

## 2020-06-23 MED ORDER — ONDANSETRON HCL 4 MG/2ML IJ SOLN: 4.0000 mg | Freq: Four times a day (QID) | INTRAMUSCULAR | Status: DC | PRN

## 2020-06-23 MED ORDER — ACETAMINOPHEN 325 MG PO TABS: 650.0000 mg | ORAL_TABLET | ORAL | Status: DC | PRN

## 2020-06-23 MED ORDER — FENTANYL CITRATE (PF) 100 MCG/2ML IJ SOLN
INTRAMUSCULAR | Status: DC | PRN
  Administered 2020-06-23: 25 ug via INTRAVENOUS

## 2020-06-23 MED ORDER — VERAPAMIL HCL 2.5 MG/ML IV SOLN
INTRAVENOUS | Status: AC
  Filled 2020-06-23: qty 2

## 2020-06-23 MED ORDER — HEPARIN SODIUM (PORCINE) 1000 UNIT/ML IJ SOLN
INTRAMUSCULAR | Status: AC
  Filled 2020-06-23: qty 1

## 2020-06-23 MED ORDER — MIDAZOLAM HCL 2 MG/2ML IJ SOLN
INTRAMUSCULAR | Status: DC | PRN
  Administered 2020-06-23: 2 mg via INTRAVENOUS

## 2020-06-23 MED ORDER — LIDOCAINE HCL (PF) 1 % IJ SOLN
INTRAMUSCULAR | Status: AC
  Filled 2020-06-23: qty 30

## 2020-06-23 SURGICAL SUPPLY — 16 items
CATH LAUNCHER 5F RADR (CATHETERS) ×1 IMPLANT
CATH OPTICROSS HD (CATHETERS) ×2 IMPLANT
CATH OPTITORQUE TIG 4.0 5F (CATHETERS) ×2 IMPLANT
CATHETER LAUNCHER 5F RADR (CATHETERS) ×2
DEVICE RAD COMP TR BAND LRG (VASCULAR PRODUCTS) ×2 IMPLANT
GLIDESHEATH SLEND SS 6F .021 (SHEATH) ×2 IMPLANT
GUIDEWIRE INQWIRE 1.5J.035X260 (WIRE) ×1 IMPLANT
INQWIRE 1.5J .035X260CM (WIRE) ×2
KIT ESSENTIALS PG (KITS) ×2 IMPLANT
KIT HEART LEFT (KITS) ×2 IMPLANT
PACK CARDIAC CATHETERIZATION (CUSTOM PROCEDURE TRAY) ×2 IMPLANT
SHEATH PROBE COVER 6X72 (BAG) ×2 IMPLANT
SLED PULL BACK IVUS (MISCELLANEOUS) ×2 IMPLANT
TRANSDUCER W/STOPCOCK (MISCELLANEOUS) ×2 IMPLANT
TUBING CIL FLEX 10 FLL-RA (TUBING) ×2 IMPLANT
WIRE ASAHI PROWATER 180CM (WIRE) ×2 IMPLANT

## 2020-06-23 NOTE — Progress Notes (Signed)
Spoke with Lillia Abed, FNP about When to stop Xarelto states to stop 2 days before procedure. Pt informed, voices understanding.

## 2020-06-23 NOTE — Progress Notes (Signed)
Ambulated to the bathroom to void and ambulated in the hallway. Tol well

## 2020-06-23 NOTE — Progress Notes (Signed)
Discharge Instructions reviewed with pt and his wife (via telephone) both voice understanding

## 2020-06-23 NOTE — Progress Notes (Signed)
1415 Received order for CR. Pt did not get PCI and is scheduled for atherectomy on 06/30/20.  We will follow up then and not see pt at this time. Luetta Nutting RN BSN 06/23/2020 2:17 PM

## 2020-06-23 NOTE — Interval H&P Note (Signed)
History and Physical Interval Note:  06/23/2020 11:57 AM  Luis Adams  has presented today for surgery, with the diagnosis of chest pain/angina- abnormal cta.  The various methods of treatment have been discussed with the patient and family. After consideration of risks, benefits and other options for treatment, the patient has consented to  Procedure(s): LEFT HEART CATH AND CORONARY ANGIOGRAPHY (N/A)  PERCUTANEOUS CORONARY INTERVENTION  as a surgical intervention.  The patient's history has been reviewed, patient examined, no change in status, stable for surgery.  I have reviewed the patient's chart and labs.  Questions were answered to the patient's satisfaction.     Cath Lab Visit (complete for each Cath Lab visit)  Clinical Evaluation Leading to the Procedure:   ACS: No.  Non-ACS:    Anginal Classification: CCS II  Anti-ischemic medical therapy: Minimal Therapy (1 class of medications)  Non-Invasive Test Results: High-risk stress test findings: cardiac mortality >3%/year  Prior CABG: No previous CABG    Bryan Lemma

## 2020-06-23 NOTE — Discharge Instructions (Signed)
Patient is to come back for Intervention on Friday 2/4.  Arrival time is  7:00am. Nothing to eat of drink after midnight. Covid test on Wednesday 2/2 between ours of 8am-3pm.  The location is [address] [place], [place]. Begin taken the Plavix on Wednesday 2/2 300mg , take 75 mg everyday after that.   Radial Site Care  This sheet gives you information about how to care for yourself after your procedure. Your health care provider may also give you more specific instructions. If you have problems or questions, contact your health care provider. What can I expect after the procedure? After the procedure, it is common to have:  Bruising and tenderness at the catheter insertion area. Follow these instructions at home: Medicines  Take over-the-counter and prescription medicines only as told by your health care provider. Insertion site care 1. Follow instructions from your health care provider about how to take care of your insertion site. Make sure you: ? Wash your hands with soap and water before you change your bandage (dressing). If soap and water are not available, use hand sanitizer. ? Change your dressing as told by your health care provider. 2. Check your insertion site every day for signs of infection. Check for: ? Redness, swelling, or pain. ? Fluid or blood. ? Pus or a bad smell. ? Warmth. 3. Do not take baths, swim, or use a hot tub for 5 days. 4. You may shower 24-48 hours after the procedure. ? Remove the dressing and gently wash the site with plain soap and water. ? Pat the area dry with a clean towel. ? Do not rub the site. That could cause bleeding. 5. Do not apply powder or lotion to the site. Activity  1. For 24 hours after the procedure, or as directed by your health care provider: ? Do not flex or bend the affected arm. ? Do not push or pull heavy objects with the affected arm. ? Do not drive yourself home from the hospital or clinic. You may drive 24  hours after the procedure. ? Do not operate machinery or power tools. ? KEEP ARM ELEVATED THE REMAINDER OF THE DAY. 2. Do not push, pull or lift anything that is heavier than 10 lb for 5 days. 3. Ask your health care provider when it is okay to: ? Return to work or school. ? Resume usual physical activities or sports. ? Resume sexual activity. General instructions  If the catheter site starts to bleed, raise your arm and put firm pressure on the site. If the bleeding does not stop, get help right away. This is a medical emergency.  DRINK PLENTY OF FLUIDS FOR THE NEXT 2-3 DAYS.  If you went home on the same day as your procedure, a responsible adult should be with you for the first 24 hours after you arrive home.  Keep all follow-up visits as told by your health care provider. This is important. Contact a health care provider if:  You have a fever.  You have redness, swelling, or yellow drainage around your insertion site. Get help right away if:  You have unusual pain at the radial site.  The catheter insertion area swells very fast.  The insertion area is bleeding, and the bleeding does not stop when you hold steady pressure on the area.  Your arm or hand becomes pale, cool, tingly, or numb. These symptoms may represent a serious problem that is an emergency. Do not wait to see if the symptoms will  go away. Get medical help right away. Call your local emergency services (911 in the [place] Do not drive yourself to the hospital. Summary  After the procedure, it is common to have bruising and tenderness at the site.  Follow instructions from your health care provider about how to take care of your radial site wound. Check the wound every day for signs of infection.  Do not push, pull or lift anything that is heavier than 10 lb for 5 days.  This information is not intended to replace advice given to you by your health care provider. Make sure you discuss any questions you have  with your health care provider. Document Revised: 06/18/2017 Document Reviewed: 06/18/2017 Elsevier Patient Education  2020 [employer].

## 2020-06-26 ENCOUNTER — Encounter (HOSPITAL_COMMUNITY): Payer: Self-pay | Admitting: Cardiology

## 2020-06-26 MED FILL — Heparin Sod (Porcine)-NaCl IV Soln 1000 Unit/500ML-0.9%: INTRAVENOUS | Qty: 500 | Status: AC

## 2020-06-28 ENCOUNTER — Other Ambulatory Visit (HOSPITAL_COMMUNITY)
Admit: 2020-06-28 | Discharge: 2020-06-28 | Disposition: A | Discharge status: Home / Self Care | Payer: Medicare Other | Attending: Cardiology | Admitting: Cardiology

## 2020-06-28 ENCOUNTER — Telehealth: Payer: Self-pay | Admitting: Cardiology

## 2020-06-28 DIAGNOSIS — Z01812 Encounter for preprocedural laboratory examination: Secondary | ICD-10-CM | POA: Diagnosis present

## 2020-06-28 DIAGNOSIS — U071 COVID-19: Secondary | ICD-10-CM | POA: Diagnosis not present

## 2020-06-28 DIAGNOSIS — I25118 Atherosclerotic heart disease of native coronary artery with other forms of angina pectoris: Secondary | ICD-10-CM

## 2020-06-28 LAB — SARS CORONAVIRUS 2 (TAT 6-24 HRS): SARS Coronavirus 2: POSITIVE — AB

## 2020-06-28 NOTE — Telephone Encounter (Signed)
Patient states he needs clarification on how he is to take his medications prior to his procedure on Friday. He states he takes a lot of different medications and is not sure, which he is supposed to stop for the procedure.

## 2020-06-28 NOTE — Telephone Encounter (Signed)
Discussed with Dr. Norris Cross nurse Wenda Low, RN as to if she had any insight into upcoming procedure and pt's questions. Per Wenda Low, RN best to see if Ernestine Mcmurray RN may (our procedure nurse) know something about upcoming procedure as well as if she may be able to answer his questions. I will send note to Ernestine Mcmurray RN.

## 2020-06-29 ENCOUNTER — Telehealth: Payer: Self-pay

## 2020-06-29 NOTE — Telephone Encounter (Addendum)
COVID-19 positive 06/28/20>Coronary Atherectomy scheduled 06/30/20 rescheduled to 07/12/20 Dr Herbie Baltimore -Pt has been instructed to resume his Xarelto today then will hold 07/10/20 until post procedure 07/12/20. - In anticipation of Coronary Atherectomy 06/30/20, pt took  Plavix 300 mg 06/28/20 then Plavix 75 mg daily   Pt instructed to stop Plavix now, restart loading dose of Plavix 300 mg 07/10/20, then Plavix 75 mg daily prior to Coronary Atherectomy now scheduled 07/12/20. -CBC/BMP 07/10/20 [facility] lab    Pt states he is concerned about side effects of atorvastatin and prefers to wait to start taking until after atherectomy and has a chance to discuss with provider.  Pt has a follow up appointment scheduled with Dr Mayford Knife 07/21/20.  Pt advised I will forward to both Dr Herbie Baltimore and Dr Mayford Knife to update them and any further recommendations.

## 2020-06-29 NOTE — Telephone Encounter (Signed)
At patient's request Coronary Atherectomy has been rescheduled to 07/12/20 arrive 8 AM for 10 AM procedure-reviewed instructions with patient.   Pt contacted pre-Coronary Atherectomy scheduled at [facility] for: Wednesday July 12, 2020 10 AM Verified arrival time and place: [facility] Main Entrance A [facility]) at: 8 AM  No solid food after midnight prior to cath, clear liquids until 5 AM day of procedure.  Hold: Xarelto-none 07/10/20 until post procedure Sildenafil-72 hours before procedure  Except hold medications AM meds can be  taken pre-cath with sips of water including: ASA 81 mg Plavix 75 mg  Confirmed patient has responsible adult to drive home post procedure and be with patient first 24 hours after arriving home: yes  You are allowed ONE visitor in the waiting room during the time you are at the hospital for your procedure. Both you and your visitor must wear a mask once you enter the hospital.

## 2020-06-29 NOTE — Telephone Encounter (Signed)
Called to discuss with patient about COVID-19 symptoms and the use of one of the available treatments for those with mild to moderate Covid symptoms and at a high risk of hospitalization.  Pt appears to qualify for outpatient treatment due to co-morbid conditions and/or a member of an at-risk group in accordance with the FDA Emergency Use Authorization.    Symptom onset: None Vaccinated: No Booster? No Immunocompromised? No Qualifiers: CAD,HTN  States he had COVID 19 05/23/20.  Esther Hardy

## 2020-06-29 NOTE — Telephone Encounter (Addendum)
06/28/20 Pre-procedure COVID-19 positive, call placed to patient to discuss. Pt reports no symptoms concerning for COVID-19, has not had vaccine Pt reports he did home test for COVID in December and tested positive, pre-procedure COVID-19 test 06/21/20 negative. Pt is aware, per [facility]  Cath Lab protocol that procedure will need to be rescheduled at least 10 days after positive result (date of positive result Day 1). Pt aware first date Dr Herbie Baltimore in Cath Lab 10 days after positive result is February 15-pt is going to talk with his wife and I will follow up with him later this morning to get Coronary Atherectomy rescheduled.

## 2020-06-29 NOTE — Telephone Encounter (Signed)
Spoke with patient about  Dr Norris Cross comments and recommendation to start statin. Pt states he will discuss with his wife tonight and make a decision.

## 2020-06-29 NOTE — Addendum Note (Signed)
Addended by: Jacqlyn Krauss on: 06/29/2020 11:07 AM   Modules accepted: Orders

## 2020-06-29 NOTE — Telephone Encounter (Signed)
Please let patient know that She needs to start the statin to prevent further progression of disease or development of restenosis after PCI

## 2020-07-07 ENCOUNTER — Ambulatory Visit: Payer: Medicare Other | Admitting: Cardiology

## 2020-07-10 ENCOUNTER — Other Ambulatory Visit: Payer: Medicare Other | Admitting: *Deleted

## 2020-07-10 ENCOUNTER — Other Ambulatory Visit: Payer: Self-pay

## 2020-07-10 DIAGNOSIS — I25118 Atherosclerotic heart disease of native coronary artery with other forms of angina pectoris: Secondary | ICD-10-CM

## 2020-07-10 LAB — BASIC METABOLIC PANEL
BUN/Creatinine Ratio: 18 (ref 10–24)
BUN: 16 mg/dL (ref 8–27)
CO2: 19 mmol/L — ABNORMAL LOW (ref 20–29)
Calcium: 9.8 mg/dL (ref 8.6–10.2)
Chloride: 104 mmol/L (ref 96–106)
Creatinine, Ser: 0.89 mg/dL (ref 0.76–1.27)
GFR calc Af Amer: 102 mL/min/{1.73_m2} (ref >59)
GFR calc non Af Amer: 88 mL/min/{1.73_m2} (ref >59)
Glucose: 96 mg/dL (ref 65–99)
Potassium: 4.2 mmol/L (ref 3.5–5.2)
Sodium: 137 mmol/L (ref 134–144)

## 2020-07-10 LAB — CBC
Hematocrit: 39.6 % (ref 37.5–51.0)
Hemoglobin: 13.6 g/dL (ref 13.0–17.7)
MCH: 31.1 pg (ref 26.6–33.0)
MCHC: 34.3 g/dL (ref 31.5–35.7)
MCV: 91 fL (ref 79–97)
Platelets: 192 10*3/uL (ref 150–450)
RBC: 4.37 x10E6/uL (ref 4.14–5.80)
RDW: 13.1 % (ref 11.6–15.4)
WBC: 9.1 10*3/uL (ref 3.4–10.8)

## 2020-07-11 ENCOUNTER — Telehealth: Payer: Self-pay | Admitting: *Deleted

## 2020-07-11 ENCOUNTER — Ambulatory Visit: Payer: Medicare Other | Admitting: Cardiology

## 2020-07-11 NOTE — Telephone Encounter (Addendum)
Pt contacted pre-catheterization scheduled at [facility] for: Wednesday July 12, 2020 10 AM Verified arrival time and place: [facility] Main Entrance A [facility]) at: 8 AM   No solid food after midnight prior to cath, clear liquids until 5 AM day of procedure.  Hold: Xarelto-none 07/10/20 until post procedure  Except hold medications AM meds can be  taken pre-cath with sips of water including: ASA 81 mg Plavix 75 mg  Confirmed patient has responsible adult to drive home post procedure and be with patient first 24 hours after arriving home: yes  You are allowed ONE visitor in the waiting room during the time you are at the hospital for your procedure. Both you and your visitor must wear a mask once you enter the hospital.     COVID-19 positive 06/28/20, pt denies any symptoms concerning for COVID-19.

## 2020-07-12 ENCOUNTER — Encounter (HOSPITAL_COMMUNITY)
Admission: RE | Disposition: A | Discharge status: Home / Self Care | Payer: Self-pay | Source: Home / Self Care | Attending: Cardiology

## 2020-07-12 ENCOUNTER — Other Ambulatory Visit: Payer: Self-pay

## 2020-07-12 ENCOUNTER — Ambulatory Visit (HOSPITAL_COMMUNITY)
Admission: RE | Admit: 2020-07-12 | Discharge: 2020-07-12 | Disposition: A | Discharge status: Home / Self Care | Payer: Medicare Other | Attending: Cardiology | Admitting: Cardiology

## 2020-07-12 DIAGNOSIS — I480 Paroxysmal atrial fibrillation: Secondary | ICD-10-CM | POA: Diagnosis not present

## 2020-07-12 DIAGNOSIS — I25118 Atherosclerotic heart disease of native coronary artery with other forms of angina pectoris: Secondary | ICD-10-CM | POA: Insufficient documentation

## 2020-07-12 DIAGNOSIS — E119 Type 2 diabetes mellitus without complications: Secondary | ICD-10-CM | POA: Diagnosis not present

## 2020-07-12 DIAGNOSIS — Z7901 Long term (current) use of anticoagulants: Secondary | ICD-10-CM | POA: Diagnosis not present

## 2020-07-12 DIAGNOSIS — I10 Essential (primary) hypertension: Secondary | ICD-10-CM | POA: Diagnosis present

## 2020-07-12 DIAGNOSIS — Z79899 Other long term (current) drug therapy: Secondary | ICD-10-CM | POA: Diagnosis not present

## 2020-07-12 DIAGNOSIS — I25119 Atherosclerotic heart disease of native coronary artery with unspecified angina pectoris: Secondary | ICD-10-CM | POA: Diagnosis present

## 2020-07-12 DIAGNOSIS — E785 Hyperlipidemia, unspecified: Secondary | ICD-10-CM | POA: Diagnosis not present

## 2020-07-12 DIAGNOSIS — Z955 Presence of coronary angioplasty implant and graft: Secondary | ICD-10-CM

## 2020-07-12 DIAGNOSIS — R931 Abnormal findings on diagnostic imaging of heart and coronary circulation: Secondary | ICD-10-CM | POA: Diagnosis present

## 2020-07-12 DIAGNOSIS — I4891 Unspecified atrial fibrillation: Secondary | ICD-10-CM | POA: Diagnosis present

## 2020-07-12 DIAGNOSIS — Z8616 Personal history of COVID-19: Secondary | ICD-10-CM | POA: Insufficient documentation

## 2020-07-12 DIAGNOSIS — I714 Abdominal aortic aneurysm, without rupture: Secondary | ICD-10-CM | POA: Diagnosis not present

## 2020-07-12 HISTORY — PX: CORONARY ATHERECTOMY: CATH118238

## 2020-07-12 HISTORY — PX: CENTRAL LINE INSERTION: CATH118232

## 2020-07-12 HISTORY — PX: INTRAVASCULAR IMAGING/OCT: CATH118326

## 2020-07-12 LAB — POCT ACTIVATED CLOTTING TIME
Activated Clotting Time: 273 seconds
Activated Clotting Time: 255 seconds
Activated Clotting Time: 243 seconds
Activated Clotting Time: 231 seconds
Activated Clotting Time: 196 seconds

## 2020-07-12 SURGERY — CORONARY ATHERECTOMY
Anesthesia: LOCAL

## 2020-07-12 MED ORDER — SODIUM CHLORIDE 0.9% FLUSH: 3.0000 mL | INTRAVENOUS | Status: DC | PRN

## 2020-07-12 MED ORDER — ONDANSETRON HCL 4 MG/2ML IJ SOLN: 4.0000 mg | Freq: Four times a day (QID) | INTRAMUSCULAR | Status: DC | PRN

## 2020-07-12 MED ORDER — VIPERSLIDE LUBRICANT OPTIME
TOPICAL | Status: DC | PRN
  Administered 2020-07-12: 20 mL via SURGICAL_CAVITY

## 2020-07-12 MED ORDER — LIDOCAINE HCL (PF) 1 % IJ SOLN
INTRAMUSCULAR | Status: AC
  Filled 2020-07-12: qty 30

## 2020-07-12 MED ORDER — LABETALOL HCL 5 MG/ML IV SOLN: 10.0000 mg | INTRAVENOUS | Status: DC | PRN

## 2020-07-12 MED ORDER — SODIUM CHLORIDE 0.9% FLUSH: 3.0000 mL | Freq: Two times a day (BID) | INTRAVENOUS | Status: DC

## 2020-07-12 MED ORDER — VERAPAMIL HCL 2.5 MG/ML IV SOLN
INTRAVENOUS | Status: DC | PRN
  Administered 2020-07-12: 10 mL via INTRA_ARTERIAL

## 2020-07-12 MED ORDER — VERAPAMIL HCL 2.5 MG/ML IV SOLN
INTRAVENOUS | Status: AC
  Filled 2020-07-12: qty 2

## 2020-07-12 MED ORDER — ATORVASTATIN CALCIUM 10 MG PO TABS: 20.0000 mg | ORAL_TABLET | Freq: Every day | ORAL | Status: DC

## 2020-07-12 MED ORDER — CARVEDILOL 6.25 MG PO TABS: 6.2500 mg | ORAL_TABLET | Freq: Two times a day (BID) | ORAL | Status: DC

## 2020-07-12 MED ORDER — HEPARIN SODIUM (PORCINE) 1000 UNIT/ML IJ SOLN
INTRAMUSCULAR | Status: AC
  Filled 2020-07-12: qty 2

## 2020-07-12 MED ORDER — CLOPIDOGREL BISULFATE 75 MG PO TABS: 75.0000 mg | ORAL_TABLET | Freq: Every day | ORAL | Status: DC

## 2020-07-12 MED ORDER — FENTANYL CITRATE (PF) 100 MCG/2ML IJ SOLN
INTRAMUSCULAR | Status: DC | PRN
  Administered 2020-07-12 (×5): 25 ug via INTRAVENOUS

## 2020-07-12 MED ORDER — IOHEXOL 350 MG/ML SOLN
INTRAVENOUS | Status: DC | PRN
  Administered 2020-07-12: 140 mL

## 2020-07-12 MED ORDER — SODIUM CHLORIDE 0.9 % IV SOLN
5.0000 mg/kg | INTRAVENOUS | Status: DC
  Filled 2020-07-12: qty 17.24

## 2020-07-12 MED ORDER — HEPARIN (PORCINE) IN NACL 1000-0.9 UT/500ML-% IV SOLN
INTRAVENOUS | Status: AC
  Filled 2020-07-12: qty 1000

## 2020-07-12 MED ORDER — LIDOCAINE HCL (PF) 1 % IJ SOLN
INTRAMUSCULAR | Status: DC | PRN
  Administered 2020-07-12: 10 mL
  Administered 2020-07-12: 2 mL

## 2020-07-12 MED ORDER — SODIUM CHLORIDE 0.9 % IV SOLN: 250.0000 mL | INTRAVENOUS | Status: DC | PRN

## 2020-07-12 MED ORDER — SODIUM CHLORIDE 0.9 % IV SOLN: INTRAVENOUS | Status: AC

## 2020-07-12 MED ORDER — NITROGLYCERIN 1 MG/10 ML FOR IR/CATH LAB
INTRA_ARTERIAL | Status: AC
  Filled 2020-07-12: qty 10

## 2020-07-12 MED ORDER — MIDAZOLAM HCL 2 MG/2ML IJ SOLN
INTRAMUSCULAR | Status: AC
  Filled 2020-07-12: qty 2

## 2020-07-12 MED ORDER — FENTANYL CITRATE (PF) 100 MCG/2ML IJ SOLN
INTRAMUSCULAR | Status: AC
  Filled 2020-07-12: qty 2

## 2020-07-12 MED ORDER — ASPIRIN 81 MG PO CHEW: 81.0000 mg | CHEWABLE_TABLET | ORAL | 0 refills | Status: DC

## 2020-07-12 MED ORDER — IOHEXOL 350 MG/ML SOLN
INTRAVENOUS | Status: AC
  Filled 2020-07-12: qty 1

## 2020-07-12 MED ORDER — CLOPIDOGREL BISULFATE 75 MG PO TABS: 75.0000 mg | ORAL_TABLET | ORAL | Status: DC

## 2020-07-12 MED ORDER — SODIUM CHLORIDE 0.9 % WEIGHT BASED INFUSION
1.0000 mL/kg/h | INTRAVENOUS | Status: DC
  Administered 2020-07-12: 250 mL via INTRAVENOUS

## 2020-07-12 MED ORDER — AMLODIPINE BESYLATE 2.5 MG PO TABS
7.5000 mg | ORAL_TABLET | Freq: Every evening | ORAL | Status: DC
Start: 2020-07-12 — End: 2020-07-13

## 2020-07-12 MED ORDER — SODIUM CHLORIDE 0.9 % IV SOLN
INTRAVENOUS | Status: DC | PRN
  Administered 2020-07-12: 431 mg via INTRAVENOUS

## 2020-07-12 MED ORDER — HEPARIN SODIUM (PORCINE) 1000 UNIT/ML IJ SOLN
INTRAMUSCULAR | Status: DC | PRN
  Administered 2020-07-12 (×3): 3000 [IU] via INTRAVENOUS
  Administered 2020-07-12: 8000 [IU] via INTRAVENOUS

## 2020-07-12 MED ORDER — ACETAMINOPHEN 325 MG PO TABS: 650.0000 mg | ORAL_TABLET | ORAL | Status: DC | PRN

## 2020-07-12 MED ORDER — HEPARIN (PORCINE) IN NACL 1000-0.9 UT/500ML-% IV SOLN
INTRAVENOUS | Status: AC
  Filled 2020-07-12: qty 500

## 2020-07-12 MED ORDER — ASPIRIN 81 MG PO CHEW: 81.0000 mg | CHEWABLE_TABLET | ORAL | Status: DC

## 2020-07-12 MED ORDER — SODIUM CHLORIDE 0.9 % WEIGHT BASED INFUSION
3.0000 mL/kg/h | INTRAVENOUS | Status: DC
  Administered 2020-07-12: 3 mL/kg/h via INTRAVENOUS

## 2020-07-12 MED ORDER — NITROGLYCERIN 1 MG/10 ML FOR IR/CATH LAB
INTRA_ARTERIAL | Status: DC | PRN
  Administered 2020-07-12 (×4): 200 ug via INTRACORONARY

## 2020-07-12 MED ORDER — MIDAZOLAM HCL 2 MG/2ML IJ SOLN
INTRAMUSCULAR | Status: DC | PRN
  Administered 2020-07-12: 1 mg via INTRAVENOUS
  Administered 2020-07-12: 2 mg via INTRAVENOUS
  Administered 2020-07-12 (×3): 1 mg via INTRAVENOUS

## 2020-07-12 MED ORDER — RIVAROXABAN 20 MG PO TABS: 20.0000 mg | ORAL_TABLET | Freq: Every day | ORAL | Status: DC

## 2020-07-12 MED ORDER — QUINAPRIL HCL 10 MG PO TABS: 40.0000 mg | ORAL_TABLET | Freq: Every day | ORAL | Status: DC

## 2020-07-12 MED ORDER — CLOPIDOGREL BISULFATE 75 MG PO TABS: 75.0000 mg | ORAL_TABLET | Freq: Every day | ORAL | 3 refills | Status: DC

## 2020-07-12 MED ORDER — HYDRALAZINE HCL 20 MG/ML IJ SOLN: 10.0000 mg | INTRAMUSCULAR | Status: DC | PRN

## 2020-07-12 MED ORDER — SODIUM CHLORIDE 0.9 % IV SOLN
250.0000 mL | INTRAVENOUS | Status: DC | PRN
Start: 2020-07-12 — End: 2020-07-13

## 2020-07-12 MED ORDER — HEPARIN (PORCINE) IN NACL 1000-0.9 UT/500ML-% IV SOLN
INTRAVENOUS | Status: DC | PRN
  Administered 2020-07-12 (×3): 500 mL

## 2020-07-12 MED ORDER — NITROGLYCERIN 0.4 MG SL SUBL: 0.4000 mg | SUBLINGUAL_TABLET | SUBLINGUAL | Status: DC | PRN

## 2020-07-12 MED ORDER — SODIUM CHLORIDE 0.9 % IV SOLN
INTRAVENOUS | Status: AC | PRN
  Administered 2020-07-12: 10 mL/h via INTRAVENOUS

## 2020-07-12 SURGICAL SUPPLY — 32 items
BAG SNAP BAND KOVER 36X36 (MISCELLANEOUS) ×3 IMPLANT
BALLN SAPPHIRE 3.0X12 (BALLOONS) ×3
BALLN SAPPHIRE ~~LOC~~ 2.5X12 (BALLOONS) IMPLANT
BALLN SAPPHIRE ~~LOC~~ 4.0X8 (BALLOONS) ×3 IMPLANT
BALLN TREK OTW 2.5X12 (BALLOONS) ×3
BALLOON SAPPHIRE 3.0X12 (BALLOONS) ×2 IMPLANT
BALLOON TREK OTW 2.5X12 (BALLOONS) ×2 IMPLANT
CATH DRAGONFLY OPSTAR (CATHETERS) ×6 IMPLANT
CATH LAUNCHER 6FR AL.75 (CATHETERS) ×3 IMPLANT
CATH TELESCOPE 6F GEC (CATHETERS) ×3 IMPLANT
COVER DOME SNAP 22 D (MISCELLANEOUS) ×3 IMPLANT
CROWN DIAMONDBACK CLASSIC 1.25 (BURR) ×3 IMPLANT
DEVICE RAD COMP TR BAND LRG (VASCULAR PRODUCTS) ×3 IMPLANT
ELECT DEFIB PAD ADLT CADENCE (PAD) ×3 IMPLANT
GLIDESHEATH SLEND SS 6F .021 (SHEATH) ×3 IMPLANT
GUIDEWIRE INQWIRE 1.5J.035X260 (WIRE) ×2 IMPLANT
INQWIRE 1.5J .035X260CM (WIRE) ×3
KIT ENCORE 26 ADVANTAGE (KITS) ×3 IMPLANT
KIT HEART LEFT (KITS) ×3 IMPLANT
LUBRICANT VIPERSLIDE CORONARY (MISCELLANEOUS) ×3 IMPLANT
PACK CARDIAC CATHETERIZATION (CUSTOM PROCEDURE TRAY) ×3 IMPLANT
SHEATH PINNACLE 6F 10CM (SHEATH) ×3 IMPLANT
SHEATH PROBE COVER 6X72 (BAG) ×3 IMPLANT
STENT RESOLUTE ONYX 2.5X12 (Permanent Stent) IMPLANT
STENT RESOLUTE ONYX 3.0X15 (Permanent Stent) ×3 IMPLANT
STENT RESOLUTE ONYX 4.0X34 (Permanent Stent) ×3 IMPLANT
STENT RESOLUTE ONYX 4.5X12 (Permanent Stent) ×3 IMPLANT
TRANSDUCER W/STOPCOCK (MISCELLANEOUS) ×3 IMPLANT
TUBING CIL FLEX 10 FLL-RA (TUBING) ×3 IMPLANT
WIRE ASAHI PROWATER 180CM (WIRE) ×3 IMPLANT
WIRE ASAHI PROWATER 300CM (WIRE) ×3 IMPLANT
WIRE VIPERWIRE COR FLEX .012 (WIRE) ×3 IMPLANT

## 2020-07-12 NOTE — Discharge Instructions (Signed)
Radial Site Care °Refer to this sheet in the next few weeks. These instructions provide you with information on caring for yourself after your procedure. Your caregiver may also give you more specific instructions. Your treatment has been planned according to current medical practices, but problems sometimes occur. Call your caregiver if you have any problems or questions after your procedure. °HOME CARE INSTRUCTIONS °· You may shower the day after the procedure. Remove the bandage (dressing) and gently wash the site with plain soap and water. Gently pat the site dry.  °· Do not apply powder or lotion to the site.  °· Do not submerge the affected site in water for 3 to 5 days.  °· Inspect the site at least twice daily.  °· Do not flex or bend the affected arm for 24 hours.  °· No lifting over 5 pounds (2.3 kg) for 5 days after your procedure.  °· Do not drive home if you are discharged the same day of the procedure. Have someone else drive you.  °· You may drive 24 hours after the procedure unless otherwise instructed by your caregiver.  °What to expect: °· Any bruising will usually fade within 1 to 2 weeks.  °· Blood that collects in the tissue (hematoma) may be painful to the touch. It should usually decrease in size and tenderness within 1 to 2 weeks.  °SEEK IMMEDIATE MEDICAL CARE IF: °· You have unusual pain at the radial site.  °· You have redness, warmth, swelling, or pain at the radial site.  °· You have drainage (other than a small amount of blood on the dressing).  °· You have chills.  °· You have a fever or persistent symptoms for more than 72 hours.  °· You have a fever and your symptoms suddenly get worse.  °· Your arm becomes pale, cool, tingly, or numb.  °· You have heavy bleeding from the site. Hold pressure on the site.  ° ° °Groin Site Care °Refer to this sheet in the next few weeks. These instructions provide you with information on caring for yourself after your procedure. Your caregiver may also  give you more specific instructions. Your treatment has been planned according to current medical practices, but problems sometimes occur. Call your caregiver if you have any problems or questions after your procedure. °HOME CARE INSTRUCTIONS °· You may shower 24 hours after the procedure. Remove the bandage (dressing) and gently wash the site with plain soap and water. Gently pat the site dry.  °· Do not apply powder or lotion to the site.  °· Do not sit in a bathtub, swimming pool, or whirlpool for 5 to 7 days.  °· No bending, squatting, or lifting anything over 10 pounds (4.5 kg) as directed by your caregiver.  °· Inspect the site at least twice daily.  °· Do not drive home if you are discharged the same day of the procedure. Have someone else drive you.  °· You may drive 24 hours after the procedure unless otherwise instructed by your caregiver.  °What to expect: °· Any bruising will usually fade within 1 to 2 weeks.  °· Blood that collects in the tissue (hematoma) may be painful to the touch. It should usually decrease in size and tenderness within 1 to 2 weeks.  °SEEK IMMEDIATE MEDICAL CARE IF: °· You have unusual pain at the groin site or down the affected leg.  °· You have redness, warmth, swelling, or pain at the groin site.  °· You have drainage (  other than a small amount of blood on the dressing).  °· You have chills.  °· You have a fever or persistent symptoms for more than 72 hours.  °· You have a fever and your symptoms suddenly get worse.  °· Your leg becomes pale, cool, tingly, or numb.  °You have heavy bleeding from the site. Hold pressure on the site. . ° °

## 2020-07-12 NOTE — Progress Notes (Signed)
Discussed stents, Plavix importance, restrictions, diet, exercise, NTG and CRPII. Pt voiced understanding. Will refer to [facility] (still a 35 min drive for him). [id number] Ethelda Chick CES, ACSM 4:01 PM 07/12/2020

## 2020-07-12 NOTE — Interval H&P Note (Signed)
History and Physical Interval Note:  07/12/2020 9:40 AM  Luis Adams  has presented today for surgery, with the diagnosis of Known RCA CAD with Class II-III Angina.  The various methods of treatment have been discussed with the patient and family. After consideration of risks, benefits and other options for treatment, the patient has consented to  Procedure(s): CORONARY ATHERECTOMY (N/A)  PERCUTANEOUS CORONARY INTERVENTION  as a surgical intervention.  The patient's history has been reviewed, patient examined, no change in status, stable for surgery.  I have reviewed the patient's chart and labs.  Questions were answered to the patient's satisfaction.    Cath Lab Visit (complete for each Cath Lab visit)  Clinical Evaluation Leading to the Procedure:   ACS: No.  Non-ACS:    Anginal Classification: CCS III  Anti-ischemic medical therapy: Maximal Therapy (2 or more classes of medications)  Non-Invasive Test Results: High-risk stress test findings: cardiac mortality >3%/year  Prior CABG: No previous CABG    Bryan Lemma

## 2020-07-12 NOTE — Progress Notes (Signed)
Site area: Right groin a 6 french arterial sheath was removed  Site Prior to Removal:  Level 0  Pressure Applied For 20 MINUTES    Bedrest Beginning at 1545p X 4 hours  Manual:   Yes.    Patient Status During Pull:  stable  Post Pull Groin Site:  Level 0  Post Pull Instructions Given:  Yes.    Post Pull Pulses Present:  Yes.    Dressing Applied:  Yes.    Comments:

## 2020-07-12 NOTE — Discharge Summary (Signed)
Discharge Summary for Same Day PCI   Patient ID: Luis Adams MRN: [id number]; DOB: [birth year]  Admit date: 07/12/2020 Discharge date: 07/12/2020  Primary Care Provider: Kathlee Nations, MD  Primary Cardiologist: Armanda Magic, MD  Primary Electrophysiologist:  None   Discharge Diagnoses    Principal Problem:   Coronary artery disease involving native coronary artery of native heart with angina pectoris [facility]) Active Problems:   New onset atrial fibrillation (HCC)   Benign essential HTN   DM (diabetes mellitus), type 2 (HCC)   Abnormal cardiac CT angiography    Diagnostic Studies/Procedures    Cardiac Catheterization 07/12/2020:   Target Lesion Site #1 prox RCA to Mid RCA lesion is 60% stenosed with 80% stenosed side branch in RV Branch. Mid RCA-1 lesion is 85% stenosed.  Orbital atherectomy and predilation performed throughout this segment, 4 passes through the distal, 3 to the more proximal  A drug-eluting stent was successfully placed using a STENT RESOLUTE ONYX 4.0X34. Postdilated to 4.3 mm  Post intervention, there is a 0% residual stenosis.  Ost RCA to Prox RCA lesion is 65% stenosed.-With OCT this appeared to be a disrupted area of plaque, potential guide catheter dissection. Decision was made to stent to cover it.  A drug-eluting stent was successfully placed overlapping the initial stent to approximately and to the ostium of the RCA, using a STENT RESOLUTE ONYX 4.5X12. Postdilated to 4.7 mm including the overlapping segment.  Post intervention, there is a 0% residual stenosis.  Mid RCA-2 lesion is 85% stenosed. Likely distal edge tear from the nose cone of the atherectomy catheter. Covered with another stent.  A drug-eluting stent was successfully placed overlapping distally to cover the lesion, using a STENT RESOLUTE ONYX 3.0X15. Postdilated and taper fashion from 4.3 to 3.3 mm.  Post intervention, there is a 0% residual stenosis.   SUMMARY  Successful extensive  OCT guided orbital atherectomy with DES PCI of the ostial to mid RCA using 3 overlapping DES stents =>  ? Resolute Onyx DES 4.5 mm x 12 mm ostial (postdilated to 4.8 mm),  ? Resolute Onyx DES 4.0 mm x 34 mm) covering the major atherectomized segment (postdilated to 4.35 mm) =>  ? Overlapping distal with a Resolute Onyx DES 3.0 Mm 15 Mm (post dilation at the overlap to 4.3 mm.)  RFV central venous line placed, but temp wire not required after the use of Aminophylline   RECOMMENDATIONS  Restart Xarelto this evening at 8:00 PM  Okay to do same-day discharge  Aggressive risk factor modification, atorvastatin had not yet been started, I wrote for to start.  However, would have low threshold to be more aggressive in follow-up   Bryan Lemma, MD  Diagnostic Dominance: Right    Intervention     _____________   History of Present Illness     Luis Adams is a 70 y.o. male with a hx of paroxysmal atrial fibrillation on Xarelto, AAA (details unclear, followed by PCP), HTN, DM, RBBB, and habitual alcohol.Echocardiogram 07/22/18 showed EF 55-60%, mildly enlarged RV. Nuc 07/22/18 was normal, EF 53%. He reported dizziness with metoprolol (with normal HR and BP per report) therefore this was transitioned to diltiazem.  He was seen by Dr. Mayford Knife 03/31/20 and reported chest pain which occurred under stress or during times of high emotions.Due to chest pain, coronary CTA 04/19/2020 was obtained which showed a coronary calcium score of 2413 which is 96 percentile for age and sex matched control. There was moderate atherosclerosis and significant blooming artifact  in the mid and distal LCx with plans for FFR flow analysis. This showed possible hemodynamically flow-limiting lesion in the proximal to mid RCA and distal LAD lesions with recommendations for cardiac catheterization.  He was then seen by Georgie Chard, NP in follow up for cath prep/consent however he was very reluctant and wished to  follow with his PCP to discuss. He eventually decided to pursue LHC however pt and wife had been exposed to COVID and were symptomatic therefore initial cath was cancelled with plans to rescudule. Due to lapse of time since last pre-procedure assessment, he was seen again in the office and reported no chest pain, SOB, palpitations, LE edema, or palpitations.   Instructed regarding holding Xarelto for 48 hours prior to cath and the need for repeat COVID and pre labs.  Cardiac catheterization was arranged for further evaluation.  Hospital Course     The patient underwent cardiac cath as noted above with successful extensive OCT guided orbital atherectomy with DES/PCI of ostial mRCA with overlapping DES x3. Plan for DAPT with ASA/plavix and Xarelto for at least one month, though ASA will be dosed every other day, after discussion with Dr. Herbie Baltimore. The patient was seen by cardiac rehab while in short stay. There were no observed complications post cath. Radial/femoral cath site was re-evaluated at the time of assessment and patient instructions and found to be stable without any complications. Instructions/precautions regarding cath site care were given prior to discharge.  Luis Adams was seen by Dr. Herbie Baltimore and determined stable for discharge home. Follow up with our office has been arranged. Medications are listed below. Pertinent changes include n/a.   I talked with the patient regarding his statin. Has been on Lipitor 20mg  daily and does report myalgias. Discussed the options of PSCK9 referral which he was initially hesitant about but agreeable after discussion. He has significant cost with Xarelto and was concerned about another high cost medication.    _____________  Cath/PCI Registry Performance & Quality Measures: 1. Aspirin prescribed? - Yes 2. ADP Receptor Inhibitor (Plavix/Clopidogrel, Brilinta/Ticagrelor or Effient/Prasugrel) prescribed (includes medically managed patients)? -  Yes 3. High Intensity Statin (Lipitor 40-80mg  or Crestor 20-40mg ) prescribed? - No - intolerant 4. For EF <40%, was ACEI/ARB prescribed? - Not Applicable (EF >/= 40%) 5. For EF <40%, Aldosterone Antagonist (Spironolactone or Eplerenone) prescribed? - Not Applicable (EF >/= 40%) 6. Cardiac Rehab Phase II ordered (Included Medically managed Patients)? - Yes  _____________   Discharge Vitals Blood pressure (!) 162/86, pulse 70, temperature 97.9 F (36.6 C), temperature source Oral, resp. rate 19, height 6' (1.829 m), weight 86.2 kg, SpO2 99 %.  Filed Weights   07/12/20 0814  Weight: 86.2 kg    Last Labs & Radiologic Studies    CBC Recent Labs    07/10/20 1014  WBC 9.1  HGB 13.6  HCT 39.6  MCV 91  PLT 192   Basic Metabolic Panel Recent Labs    07/12/20 1014  NA 137  K 4.2  CL 104  CO2 19*  GLUCOSE 96  BUN 16  CREATININE 0.89  CALCIUM 9.8   Liver Function Tests No results for input(s): AST, ALT, ALKPHOS, BILITOT, PROT, ALBUMIN in the last 72 hours. No results for input(s): LIPASE, AMYLASE in the last 72 hours. High Sensitivity Troponin:   No results for input(s): TROPONINIHS in the last 720 hours.  BNP Invalid input(s): POCBNP D-Dimer No results for input(s): DDIMER in the last 72 hours. Hemoglobin A1C No results for input(s):  HGBA1C in the last 72 hours. Fasting Lipid Panel No results for input(s): CHOL, HDL, LDLCALC, TRIG, CHOLHDL, LDLDIRECT in the last 72 hours. Thyroid Function Tests No results for input(s): TSH, T4TOTAL, T3FREE, THYROIDAB in the last 72 hours.  Invalid input(s): FREET3 _____________  CARDIAC CATHETERIZATION  Result Date: 07/12/2020  Target Lesion Site #1 prox RCA to Mid RCA lesion is 60% stenosed with 80% stenosed side branch in RV Branch. Mid RCA-1 lesion is 85% stenosed.  Orbital atherectomy and predilation performed throughout this segment, 4 passes through the distal, 3 to the more proximal  A drug-eluting stent was successfully  placed using a STENT RESOLUTE ONYX 4.0X34. Postdilated to 4.3 mm  Post intervention, there is a 0% residual stenosis.  Ost RCA to Prox RCA lesion is 65% stenosed.-With OCT this appeared to be a disrupted area of plaque, potential guide catheter dissection. Decision was made to stent to cover it.  A drug-eluting stent was successfully placed overlapping the initial stent to approximately and to the ostium of the RCA, using a STENT RESOLUTE ONYX 4.5X12. Postdilated to 4.7 mm including the overlapping segment.  Post intervention, there is a 0% residual stenosis.  Mid RCA-2 lesion is 85% stenosed. Likely distal edge tear from the nose cone of the atherectomy catheter. Covered with another stent.  A drug-eluting stent was successfully placed overlapping distally to cover the lesion, using a STENT RESOLUTE ONYX 3.0X15. Postdilated and taper fashion from 4.3 to 3.3 mm.  Post intervention, there is a 0% residual stenosis.  SUMMARY  Successful extensive OCT guided orbital atherectomy with DES PCI of the ostial to mid RCA using 3 overlapping DES stents =>  Resolute Onyx DES 4.5 mm x 12 mm ostial (postdilated to 4.8 mm),  Resolute Onyx DES 4.0 mm x 34 mm) covering the major atherectomized segment (postdilated to 4.35 mm) =>  Overlapping distal with a Resolute Onyx DES 3.0 Mm 15 Mm (post dilation at the overlap to 4.3 mm.)  RFV central venous line placed, but temp wire not required after the use of Aminophylline RECOMMENDATIONS  Restart Xarelto this evening at 8:00 PM  Okay to do same-day discharge  Aggressive risk factor modification, atorvastatin had not yet been started, I wrote for to start.  However, would have low threshold to be more aggressive in follow-up Bryan Lemma, MD  CARDIAC CATHETERIZATION  Result Date: 06/23/2020  The left ventricular systolic function is normal.  LV end diastolic pressure is normal.  The left ventricular ejection fraction is 55-65% by visual estimate.  There is no  aortic valve stenosis.  Prox RCA to Mid RCA lesion is 45% stenosed with 80% stenosed side branch in RV Branch.  Mid RCA lesion is 85% stenosed.  Dist LAD lesion is 40% stenosed with 80% stenosed side branch in 2nd Sept.   Severe heavily calcified proximal to mid RCA with extensive 40 to 60% calcified stenosis followed by a very focal area of 90% stenosis at a bend point. ->  IVUS performed of the proximal segment of the calcified area indicates eccentric heavily calcified plaque.  Unable to advance the catheter be to the major lesion. ->  Lesion will require atherectomy.  Mild proximal LCx disease prior to giving off OM1, mild diffuse LAD disease with the most notable lesion being in the ostium of a major septal trunk/tandem LAD from the mid LAD (roughly 80%).  The follow-on LAD however gives off a diagonal branch and is relatively free of disease.  Poor quality LV gram,  but appears to have normal wall motion/EF and normal EDP. RECOMMENDATIONS  Patient be discharged today after prolonged bedrest because of additional heparin given for IVUS.  He will be scheduled for orbital atherectomy based PCI of the RCA on Friday, 06/30/2021.  He will restart Xarelto tonight at 8 PM  He will loaded with Plavix (clopidogrel) 300 mg (4 tabs) on Wednesday, 06/28/2021, and then continue 75 mg twice daily Bryan Lemmaavid Harding, MD  Disposition   Pt is being discharged home today in good condition.  Follow-up Plans & Appointments     Follow-up Information    Quintella Reicherturner, Traci R, MD Follow up on 07/21/2020.   Specialty: Cardiology Why: at 11:40am for your follow up appt Contact information: [address] [place] [address] [phone]              Discharge Instructions    [facility]   Complete by: As directed    Reason For Referral: Lipids   Amb Referral to Cardiac Rehabilitation   Complete by: As directed    To [facility]   Diagnosis:  Coronary Stents PTCA     After  initial evaluation and assessments completed: Virtual Based Care may be provided alone or in conjunction with Phase 2 Cardiac Rehab based on patient barriers.: Yes      Discharge Medications   Allergies as of 07/12/2020   No Known Allergies     Medication List    TAKE these medications   amLODipine 5 MG tablet Commonly known as: NORVASC Take 1.5 tablets (7.5 mg total) by mouth daily. What changed: when to take this   ascorbic acid 500 MG tablet Commonly known as: VITAMIN C Take 500 mg by mouth daily.   aspirin 81 MG chewable tablet Chew 1 tablet (81 mg total) by mouth every other day.   atorvastatin 20 MG tablet Commonly known as: LIPITOR Take 1 tablet (20 mg total) by mouth daily.   carvedilol 6.25 MG tablet Commonly known as: COREG Take 1 tablet (6.25 mg total) by mouth 2 (two) times daily.   clopidogrel 75 MG tablet Commonly known as: Plavix Take 1 tablet (75 mg total) by mouth daily. On day 1 (06/28/20) take 4 tab - then going forward - take 1 tab daily   nitroGLYCERIN 0.4 MG SL tablet Commonly known as: NITROSTAT Place 1 tablet (0.4 mg total) under the tongue every 5 (five) minutes as needed for chest pain.   quinapril 40 MG tablet Commonly known as: ACCUPRIL Take 1 tablet by mouth once daily   sildenafil 20 MG tablet Commonly known as: REVATIO Take 100 mg by mouth daily as needed (erectile dysfunction).   VISINE OP Place 1 drop into both eyes 3 (three) times daily as needed (irritation/ dry eyes).   Xarelto 20 MG Tabs tablet Generic drug: rivaroxaban TAKE 1 TABLET BY MOUTH ONCE DAILY WITH SUPPER What changed: See the new instructions.         Allergies No Known Allergies  Outstanding Labs/Studies   Outpt lipid clinic referral at discharge   Duration of Discharge Encounter   Greater than 30 minutes including physician time.  Signed, Laverda PageLindsay Cartha Rotert, NP 07/12/2020, 4:12 PM

## 2020-07-13 ENCOUNTER — Encounter (HOSPITAL_COMMUNITY): Payer: Self-pay | Admitting: Cardiology

## 2020-07-13 ENCOUNTER — Telehealth: Payer: Self-pay | Admitting: Cardiology

## 2020-07-13 NOTE — Telephone Encounter (Signed)
° ° ° °  Pt c/o medication issue:  1. Name of Medication: aspirin 81 MG chewable tablet  2. How are you currently taking this medication (dosage and times per day)? Chew 1 tablet (81 mg total) by mouth every other day.  3. Are you having a reaction (difficulty breathing--STAT)?   4. What is your medication issue? Pt's wife said yesterday during pt's discharge they were told, the pt needs to take his aspirin everyday but when they got home on his paperwork its says to take every other day. They want to confirm the pt's dosage. Also, pt is asking if he can sleep on his side.

## 2020-07-13 NOTE — Telephone Encounter (Signed)
Spoke with the patient's wife and advised her that the patient should be taking aspirin 81 mg every other day as it states on his discharge summary. Also advised that he should be okay to sleep on his side tonight, but make sure they are monitoring his site. She verbalized understanding.

## 2020-07-20 ENCOUNTER — Telehealth (HOSPITAL_COMMUNITY): Payer: Self-pay

## 2020-07-20 NOTE — Telephone Encounter (Signed)
Cardiac rehab referral for Ph.II faxed to [facility]. 

## 2020-07-21 ENCOUNTER — Encounter: Payer: Self-pay | Admitting: Cardiology

## 2020-07-21 ENCOUNTER — Ambulatory Visit (INDEPENDENT_AMBULATORY_CARE_PROVIDER_SITE_OTHER): Payer: Medicare Other | Admitting: Cardiology

## 2020-07-21 ENCOUNTER — Other Ambulatory Visit: Payer: Self-pay

## 2020-07-21 VITALS — BP 148/84 | HR 77 | Ht 72.0 in | Wt 187.4 lb

## 2020-07-21 DIAGNOSIS — E1100 Type 2 diabetes mellitus with hyperosmolarity without nonketotic hyperglycemic-hyperosmolar coma (NKHHC): Secondary | ICD-10-CM | POA: Diagnosis not present

## 2020-07-21 DIAGNOSIS — I480 Paroxysmal atrial fibrillation: Secondary | ICD-10-CM

## 2020-07-21 DIAGNOSIS — I10 Essential (primary) hypertension: Secondary | ICD-10-CM

## 2020-07-21 DIAGNOSIS — I208 Other forms of angina pectoris: Secondary | ICD-10-CM | POA: Diagnosis not present

## 2020-07-21 DIAGNOSIS — I714 Abdominal aortic aneurysm, without rupture, unspecified: Secondary | ICD-10-CM

## 2020-07-21 DIAGNOSIS — E785 Hyperlipidemia, unspecified: Secondary | ICD-10-CM

## 2020-07-21 NOTE — Patient Instructions (Signed)
Medication Instructions:  Your physician has recommended you make the following change in your medication:   STOP aspirin on 08/09/2020  *If you need a refill on your cardiac medications before your next appointment, please call your pharmacy*   Lab Work: TODAY: FLP,ALT  If you have labs (blood work) drawn today and your tests are completely normal, you will receive your results only by: Marland Kitchen MyChart Message (if you have MyChart) OR . A paper copy in the mail If you have any lab test that is abnormal or we need to change your treatment, we will call you to review the results.   Testing/Procedures: none   Follow-Up: At [facility], you and your health needs are our priority.  As part of our continuing mission to provide you with exceptional heart care, we have created designated Provider Care Teams.  These Care Teams include your primary Cardiologist (physician) and Advanced Practice Providers (APPs -  Physician Assistants and Nurse Practitioners) who all work together to provide you with the care you need, when you need it.   Your next appointment:   6 month(s)  The format for your next appointment:   In Person  Provider:   You will see one of the following Advanced Practice Providers on your designated Care Team:    Ronie Spies, PA-C  Jacolyn Reedy, PA-C   Other Instructions You have been referred to so see a pharmacist in the lipid clinic.

## 2020-07-21 NOTE — Progress Notes (Signed)
Cardiology Office Note:    Date:  07/21/2020   ID:  Luis Adams, DOB [birth year], MRN [id number]  PCP:  Kathlee Nations, MD  Cardiologist:  Armanda Magic, MD    Referring MD: Kathlee Nations, MD   Chief Complaint  Patient presents with  . Coronary Artery Disease  . Hypertension  . Atrial Fibrillation  . Hyperlipidemia    History of Present Illness:    Luis Adams is a 70 y.o. male with a hx of paroxysmal atrial fibrillation, AAA (details unclear, followed by PCP), essential HTN, DM, RBBB, habitual alcohol.  2D echo 07/22/18 showed EF 55-60%, mildly enlarged RV.  Nuc 07/22/18 was normal, EF 53%. He developed some dizziness with metoprolol (with normal HR and BP per report) so was switched to diltiazem.  Since I saw him last he had a coronary CTA showing significant ASCAD and underwent cath 05/2020 showing Prox RCA to Mid RCA lesion is 45% stenosed with 80% stenosed side branch in RV Branch, Mid RCA lesion is 85% stenosed, Dist LAD lesion is 40% stenosed with 80% stenosed side branch in 2nd Sept. He underwent extensive OCT guided orbital atherectomy with DES/PCI of ostial mRCA with overlapping DES x3. Plan for DAPT with ASA/plavix and Xarelto for at least one month, though ASA will be dosed every other day.  He was started on statin therapy in Jan and has not had an repeat FLP since then.   He is here today for followup and is doing well.  He has not had any further anginal pain.  He does describe a different pain in his chest over the left breast that is nonexertional and only lasts a second and resolves.  It is not associated with nausea or diaphoresis or SOB. This pain is not he denies any SOB, DOE, PND, orthopnea, LE edema, dizziness, palpitations or syncope. He says that he is having a lot of joint pain in his knees and hands and constipation with the Lipitor.  He is compliant with his meds and is tolerating meds with no SE.    Past Medical History:  Diagnosis Date  . AAA (abdominal aortic  aneurysm) (HCC)   . Arthritis   . Benign essential HTN 07/13/2018  . Habitual alcohol use   . Hyperlipidemia   . PAF (paroxysmal atrial fibrillation) (HCC) 07/13/2018  . Prediabetes   . RBBB     Past Surgical History:  Procedure Laterality Date  . CENTRAL LINE INSERTION  07/12/2020   Procedure: CENTRAL LINE INSERTION;  Surgeon: Marykay Lex, MD;  Location: [facility] INVASIVE CV LAB;  Service: Cardiovascular;;  . CORONARY ATHERECTOMY N/A 07/12/2020   Procedure: CORONARY ATHERECTOMY;  Surgeon: Marykay Lex, MD;  Location: [facility] INVASIVE CV LAB;  Service: Cardiovascular;  Laterality: N/A;  . INTRAVASCULAR IMAGING/OCT N/A 07/12/2020   Procedure: INTRAVASCULAR IMAGING/OCT;  Surgeon: Marykay Lex, MD;  Location: [facility] INVASIVE CV LAB;  Service: Cardiovascular;  Laterality: N/A;  . INTRAVASCULAR ULTRASOUND/IVUS N/A 06/23/2020   Procedure: Intravascular Ultrasound/IVUS;  Surgeon: Marykay Lex, MD;  Location: [facility] INVASIVE CV LAB;  Service: Cardiovascular;  Laterality: N/A;  . LEFT HEART CATH AND CORONARY ANGIOGRAPHY N/A 06/23/2020   Procedure: LEFT HEART CATH AND CORONARY ANGIOGRAPHY;  Surgeon: Marykay Lex, MD;  Location: [facility] INVASIVE CV LAB;  Service: Cardiovascular;  Laterality: N/A;  . TONSILLECTOMY      Current Medications: Current Meds  Medication Sig  . amLODipine (NORVASC) 5 MG tablet Take 1.5 tablets (7.5 mg total) by mouth daily.  Marland Kitchen  aspirin 81 MG chewable tablet Chew 1 tablet (81 mg total) by mouth every other day.  Marland Kitchen atorvastatin (LIPITOR) 20 MG tablet Take 1 tablet (20 mg total) by mouth daily.  . carvedilol (COREG) 6.25 MG tablet Take 1 tablet (6.25 mg total) by mouth 2 (two) times daily.  . clopidogrel (PLAVIX) 75 MG tablet Take 75 mg by mouth daily.  . nitroGLYCERIN (NITROSTAT) 0.4 MG SL tablet Place 1 tablet (0.4 mg total) under the tongue every 5 (five) minutes as needed for chest pain.  Marland Kitchen quinapril (ACCUPRIL) 40 MG tablet Take 1 tablet by mouth once daily  . sildenafil  (REVATIO) 20 MG tablet Take 100 mg by mouth daily as needed (erectile dysfunction).   . Tetrahydrozoline HCl (VISINE OP) Place 1 drop into both eyes 3 (three) times daily as needed (irritation/ dry eyes).  [name] 20 MG TABS tablet TAKE 1 TABLET BY MOUTH ONCE DAILY WITH SUPPER     Allergies:   Patient has no known allergies.   Social History   Socioeconomic History  . Marital status: Married    Spouse name: Not on file  . Number of children: Not on file  . Years of education: Not on file  . Highest education level: Not on file  Occupational History  . Not on file  Tobacco Use  . Smoking status: Never Smoker  . Smokeless tobacco: Never Used  Vaping Use  . Vaping Use: Never used  Substance and Sexual Activity  . Alcohol use: Yes    Alcohol/week: 6.0 standard drinks    Types: 6 Cans of beer per week    Comment: reports drinking 1 can of beer daily  . Drug use: Never  . Sexual activity: Not on file  Other Topics Concern  . Not on file  Social History Narrative  . Not on file   Social Determinants of Health   Financial Resource Strain: Not on file  Food Insecurity: Not on file  Transportation Needs: Not on file  Physical Activity: Not on file  Stress: Not on file  Social Connections: Not on file     Family History: The patient's family history includes Cancer in his maternal grandmother. He was adopted.  ROS:   Please see the history of present illness.    ROS  All other systems reviewed and negative.   EKGs/Labs/Other Studies Reviewed:    The following studies were reviewed today:  Coronary CTA  03/2020 Aorta: Normal size. Scattered calcifications in the descending aorta. No dissection.  Aortic Valve:  Trileaflet.  No calcifications.  Coronary Arteries:  Normal coronary origin.  Right dominance.  RCA is a large dominant artery that gives rise to PDA and PLVB. There is mild calcified plaque in the proximal RCA with associated stenosis of 25-49%  followed by moderate calcified plaque in the proximal RCA with associated stenosis of 50-69% but could be > 70%. There is mild calcified plaque in the mid RCA with associated stenosis of 25-49% and moderate calcified plaque in the distal RCA with associated stenosis of 50-69% but this may be overestimated due to significant blooming artifact as well as motion artifact.  Left main is a very short artery that gives rise to LAD and LCX arteries. There is no plaque.  LAD is a large vessel. There is moderate calcified plaque in the proximal and mid LAD with associated stenosis of 50-69% but may be overestimated due to blooming artifact. There is at least mild calcified plaque in the D1 but  may be greater but motion and blooming artifact precludes accurate assessment.  LCX is a non-dominant artery that gives rise to one large OM1 branch. There is mild calcified plaque in the proximal LCx with associated stenosis of 25-49%. The mid and distal LCx are not adequately visualized.  Other findings:  Normal pulmonary vein drainage into the left atrium.  Normal let atrial appendage without a thrombus.  Normal size of the pulmonary artery.  IMPRESSION: 1. Coronary calcium score of 2413. This was 96th percentile for age and sex matched control.  2.  Normal coronary origin with right dominance.  3.  Moderate atherosclerosis.  CAD RADs 3.  4. There is significant blooming artifact and the mid and distal LCx are not well visualized.  5. Consider symptom-guided anti-ischemic and preventive pharmacotherapy as well as risk factor modification per guideline-directed care.  6.  This study has been submitted for FFR flow analysis.  Cardiac Cath 06/23/2020 Conclusion    The left ventricular systolic function is normal.  LV end diastolic pressure is normal.  The left ventricular ejection fraction is 55-65% by visual estimate.  There is no aortic valve stenosis.  Prox RCA  to Mid RCA lesion is 45% stenosed with 80% stenosed side branch in RV Branch.  Mid RCA lesion is 85% stenosed.  Dist LAD lesion is 40% stenosed with 80% stenosed side branch in 2nd Sept.    Severe heavily calcified proximal to mid RCA with extensive 40 to 60% calcified stenosis followed by a very focal area of 90% stenosis at a bend point. ->  IVUS performed of the proximal segment of the calcified area indicates eccentric heavily calcified plaque.  Unable to advance the catheter be to the major lesion. ->  Lesion will require atherectomy.  Mild proximal LCx disease prior to giving off OM1, mild diffuse LAD disease with the most notable lesion being in the ostium of a major septal trunk/tandem LAD from the mid LAD (roughly 80%).  The follow-on LAD however gives off a diagonal branch and is relatively free of disease.  Poor quality LV gram, but appears to have normal wall motion/EF and normal EDP.   RECOMMENDATIONS  Patient be discharged today after prolonged bedrest because of additional heparin given for IVUS.  He will be scheduled for orbital atherectomy based PCI of the RCA on Friday, 06/30/2021. ? He will restart Xarelto tonight at 8 PM ? He will loaded with Plavix (clopidogrel) 300 mg (4 tabs) on Wednesday, 06/28/2021, and then continue 75 mg twice daily  Coronary Intervention 07/12/2020 Conclusion    Target Lesion Site #1 prox RCA to Mid RCA lesion is 60% stenosed with 80% stenosed side branch in RV Branch. Mid RCA-1 lesion is 85% stenosed.  Orbital atherectomy and predilation performed throughout this segment, 4 passes through the distal, 3 to the more proximal  A drug-eluting stent was successfully placed using a STENT RESOLUTE ONYX 4.0X34. Postdilated to 4.3 mm  Post intervention, there is a 0% residual stenosis.  Ost RCA to Prox RCA lesion is 65% stenosed.-With OCT this appeared to be a disrupted area of plaque, potential guide catheter dissection. Decision was made to  stent to cover it.  A drug-eluting stent was successfully placed overlapping the initial stent to approximately and to the ostium of the RCA, using a STENT RESOLUTE ONYX 4.5X12. Postdilated to 4.7 mm including the overlapping segment.  Post intervention, there is a 0% residual stenosis.  Mid RCA-2 lesion is 85% stenosed. Likely distal edge tear from  the nose cone of the atherectomy catheter. Covered with another stent.  A drug-eluting stent was successfully placed overlapping distally to cover the lesion, using a STENT RESOLUTE ONYX 3.0X15. Postdilated and taper fashion from 4.3 to 3.3 mm.  Post intervention, there is a 0% residual stenosis.   SUMMARY  Successful extensive OCT guided orbital atherectomy with DES PCI of the ostial to mid RCA using 3 overlapping DES stents =>  ? Resolute Onyx DES 4.5 mm x 12 mm ostial (postdilated to 4.8 mm),  ? Resolute Onyx DES 4.0 mm x 34 mm) covering the major atherectomized segment (postdilated to 4.35 mm) =>  ? Overlapping distal with a Resolute Onyx DES 3.0 Mm 15 Mm (post dilation at the overlap to 4.3 mm.)  RFV central venous line placed, but temp wire not required after the use of Aminophylline   RECOMMENDATIONS  Restart Xarelto this evening at 8:00 PM  Okay to do same-day discharge  Aggressive risk factor modification, atorvastatin had not yet been started, I wrote for to start.  However, would have low threshold to be more aggressive in follow-up    EKG:  EKG is not ordered today.   Recent Labs: 03/31/2020: ALT 19 07/10/2020: BUN 16; Creatinine, Ser 0.89; Hemoglobin 13.6; Platelets 192; Potassium 4.2; Sodium 137   Recent Lipid Panel    Component Value Date/Time   CHOL 206 (H) 03/31/2020 1235   TRIG 182 (H) 03/31/2020 1235   HDL 54 03/31/2020 1235   CHOLHDL 3.8 03/31/2020 1235   LDLCALC 120 (H) 03/31/2020 1235    Physical Exam:    VS:  BP (!) 148/84   Pulse 77   Ht 6' (1.829 m)   Wt 187 lb 6.4 oz (85 kg)   SpO2 97%   BMI  25.42 kg/m     Wt Readings from Last 3 Encounters:  07/21/20 187 lb 6.4 oz (85 kg)  07/12/20 190 lb (86.2 kg)  06/23/20 192 lb (87.1 kg)     GEN: Well nourished, well developed in no acute distress HEENT: Normal NECK: No JVD; No carotid bruits LYMPHATICS: No lymphadenopathy CARDIAC:RRR, no murmurs, rubs, gallops RESPIRATORY:  Clear to auscultation without rales, wheezing or rhonchi  ABDOMEN: Soft, non-tender, non-distended MUSCULOSKELETAL:  No edema; No deformity  SKIN: Warm and dry NEUROLOGIC:  Alert and oriented x 3 PSYCHIATRIC:  Normal affect    ASSESSMENT:    1. PAF (paroxysmal atrial fibrillation) (HCC)   2. Benign essential HTN   3. AAA (abdominal aortic aneurysm) without rupture (HCC)   4. Type 2 diabetes mellitus with hyperosmolarity without coma, without long-term current use of insulin (HCC)   5. Hyperlipidemia, unspecified hyperlipidemia type    PLAN:    In order of problems listed above:  1.  PAF -He is maintaining NSR on exam and denies any palpitations -he has not had any bleeding problems -continue Carvedilol and Xarelto  daily -SCr stable at 0.89 and Hbg 13.6 on 07/10/2020  2.  HTN -Bp borderline controlled on exam today>>he tells me he has white coat HTN and at home his BP is controlled -continue Quinapril  daily, amlodipine 7.5mg  daily and Carvedilol 6.25mg  BID -check BP daily for a week and call with results  3.  AAA -followed by PCP  4.  Type 2 DM -followed by PCP -HbA1C 5.6% in June 2021 -diet controlled at present  5.  HLD -LDL 120 in Nov 2021>>he refused statins at that time but now on Atorvastatin  daily after PCI -LDL goal <  70 with DM and AAA -check FLP and ALT  6.  ASCAD -coronary CTA showed at least moderate obstructive CAD -cath 06/2020 showed Prox RCA to Mid RCA lesion is 45% stenosed with 80% stenosed side branch in RV Branch, Mid RCA lesion is 85% stenosed, Dist LAD lesion is 40% stenosed with 80% stenosed side  branch in 2nd Sept. -s/p extensive OCT guided orbital atherectomy with DES/PCI of ostial mRCA with overlapping DES x3. -Plan for DAPT with ASA/plavix and Xarelto for at least one month, though ASA will be dosed every other day -continue statin and BB -stop ASA in on 08/09/2020   Medication Adjustments/Labs and Tests Ordered: Current medicines are reviewed at length with the patient today.  Concerns regarding medicines are outlined above.  No orders of the defined types were placed in this encounter.  No orders of the defined types were placed in this encounter.   Signed, Armanda Magic, MD  07/21/2020 11:50 AM    [facility]

## 2020-07-24 LAB — LIPID PANEL
Chol/HDL Ratio: 3.1 ratio (ref 0.0–5.0)
Cholesterol, Total: 128 mg/dL (ref 100–199)
HDL: 41 mg/dL (ref >39)
LDL Chol Calc (NIH): 68 mg/dL (ref 0–99)
Triglycerides: 103 mg/dL (ref 0–149)
VLDL Cholesterol Cal: 19 mg/dL (ref 5–40)

## 2020-07-24 LAB — ALT: ALT: 15 IU/L (ref 0–44)

## 2020-08-01 NOTE — Progress Notes (Signed)
Patient ID: Luis Adams                 DOB: [birth year]                    MRN: [id number]     HPI: Luis Adams is a 70 y.o. male patient referred to lipid clinic by Dr. Mayford Knife. PMH is significant for paroxysmal atrial fibrillation, AAA (details unclear, followed by PCP), essential HTN, DM, RBBB, habitual alcohol. At 03/2020 visit with Dr. Mayford Knife, patient reported chest pressure with activity and was scheduled for a coronary CTA to assess for CAD. Coronary CTA on 04/19/20 showed elevated calcium score of 2413 (96th percentile for age and sex). This study was submitted for FFR flow analysis, which revealed possible hemodynamically flow limiting lesion in the proximal to mid RCA and distal LAD, and the patient was scheduled for cath. Underwent cath 05/2020 showing Prox RCA to Mid RCA lesion is 45% stenosed with 80% stenosed side branch in RV Branch, Mid RCA lesion is 85% stenosed, Dist LAD lesion is 40% stenosed with 80% stenosed side branch in 2nd Sept. He underwent extensive OCT guided orbital atherectomy with DES/PCI of ostial mRCA with overlapping DES x3. At last visit with Dr. Mayford Knife on 2/25 patient reported having a lot of joint pain in his hands and knees and constipation with atorvastatin. Lipid panel from 2/25: LDL 68 (improved from 120 4 months ago).   Today, patient reports experiencing joint pain in his feet, hands, elbows, and knees. Also had cramps in both of his calves which have slightly improved. One day last week he was in so much pain he could hardly get out of bed, but he states that pain has improved over time. This pain started about 3 days after starting atorvastatin. He is still taking atorvastatin 20 mg daily despite pain. Denies chest pain, reports discomfort has improved. Asks about "cholesterol shot" (PCSK9 inhibitors) which his cardiologist mentioned at his last visit. In discussing possible medication changes, patient inquires about cost of medications as he has to take out a  loan twice yearly to afford his Xarelto--once at the beginning of the year to cover his $480 deductible and then once he reaches the donut hole later in the year.   Current Medications: Atorvastatin 20 mg daily Intolerances: No prior statin use Risk Factors: Age, CAD (PCI 06/2020), HTN, prediabetes LDL goal: <70 mg/dL  Diet: Has completely changed diet since December. Used to eat only red meat, now has cut back to only once every couple of weeks. Eats fish, chicken, [place], lot of vegetables, greens, sweet potatoes.    Exercise: Walks ~0.5-1 mile per day, more on weekends (walked 5 miles on Sunday)  Family History: The patient's family history includes Cancer in his maternal grandmother. He was adopted.  Social History: Never smoker, drinks 6 cans of beer per week  Labs: 03/31/20: TC 206, TG 182, HDL 54, LDL 120 (on no lipid lowering therapy) 07/21/20: TC 128, TG 103, HDL 41, LDL 68 (on atorvastatin 20 mg daily)  Past Medical History:  Diagnosis Date  . AAA (abdominal aortic aneurysm) (HCC)   . Arthritis   . Benign essential HTN 07/13/2018  . Habitual alcohol use   . Hyperlipidemia   . PAF (paroxysmal atrial fibrillation) (HCC) 07/13/2018  . Prediabetes   . RBBB     Current Outpatient Medications on File Prior to Visit  Medication Sig Dispense Refill  . amLODipine (NORVASC) 5 MG tablet Take 1.5 tablets (  7.5 mg total) by mouth daily. 135 tablet 3  . aspirin 81 MG chewable tablet Chew 1 tablet (81 mg total) by mouth every other day. 15 tablet 0  . atorvastatin (LIPITOR) 20 MG tablet Take 1 tablet (20 mg total) by mouth daily. 90 tablet 3  . carvedilol (COREG) 6.25 MG tablet Take 1 tablet (6.25 mg total) by mouth 2 (two) times daily. 180 tablet 2  . clopidogrel (PLAVIX) 75 MG tablet Take 75 mg by mouth daily.    . nitroGLYCERIN (NITROSTAT) 0.4 MG SL tablet Place 1 tablet (0.4 mg total) under the tongue every 5 (five) minutes as needed for chest pain. 75 tablet 1  . quinapril  (ACCUPRIL) 40 MG tablet Take 1 tablet by mouth once daily 90 tablet 1  . sildenafil (REVATIO) 20 MG tablet Take 100 mg by mouth daily as needed (erectile dysfunction).     . Tetrahydrozoline HCl (VISINE OP) Place 1 drop into both eyes 3 (three) times daily as needed (irritation/ dry eyes).    [name] 20 MG TABS tablet TAKE 1 TABLET BY MOUTH ONCE DAILY WITH SUPPER 30 tablet 5   No current facility-administered medications on file prior to visit.    No Known Allergies  Assessment/Plan:  1. Hyperlipidemia - LDL is at goal <70 mg/dL on atorvastatin 20mg  daily. Given significant joint pain that started after taking atorvastatin 20 mg daily, will decrease atorvastatin to 10 mg daily to see if pain improves, as patient would rather continue atorvastatin at this time rather than switch to another statin. If no improvement, patient instructed to stop atorvastatin for a few days to see if symptoms improve. Patient will call if no improvement in muscle and joint pain with these changes. Would switch to rosuvastatin 5 or 10 mg at that time to see if patient tolerates better. Answered patient's questions about PCSK9 inhibitors, which patient would like to research. Educated patient on importance and reason for his new medications post stent placement, including statins and antiplatelets. Encouraged patient to continue positive lifestyle changes he has already begun with his diet and exercise. Will call pt in 3 weeks to follow up with tolerability if I have not heard from pt before then.  2. Anticoagulation - Patient is on Xarelto for atrial fibrillation. Investigated co-pays with current insurance. Discovered that patient could save ~$60 per 90 day fill if he fills at preferred mail order pharmacy (co-pay should be $86 compared to $129-141 at local pharmacies). Sent in Rx to to see if patient will have cheaper co-pay. Also informed patient about [employer] [facility] to help reduce his co-pay  to $85/month once he reaches the donut hole later this year, which he says he is signed up for. Encouraged patient to investigate other Medicare Part D plans when enrollment starts later this year to try to find a plan with no deductible and lower co-pays to help lower his medication costs.   Linwood Dibbles, PharmD PGY1 Pharmacy Resident 08/02/2020 1:01 PM  Megan E. Supple, PharmD, BCACP, CPP [facility] [address] [place], [place] Phone: [phone]; Fax: [phone] 08/02/2020 1:31 PM

## 2020-08-02 ENCOUNTER — Other Ambulatory Visit: Payer: Self-pay

## 2020-08-02 ENCOUNTER — Encounter (HOSPITAL_COMMUNITY): Payer: Self-pay | Admitting: Cardiology

## 2020-08-02 ENCOUNTER — Ambulatory Visit (INDEPENDENT_AMBULATORY_CARE_PROVIDER_SITE_OTHER): Payer: Medicare Other | Admitting: Pharmacist

## 2020-08-02 DIAGNOSIS — G720 Drug-induced myopathy: Secondary | ICD-10-CM | POA: Insufficient documentation

## 2020-08-02 DIAGNOSIS — E785 Hyperlipidemia, unspecified: Secondary | ICD-10-CM | POA: Insufficient documentation

## 2020-08-02 DIAGNOSIS — I25119 Atherosclerotic heart disease of native coronary artery with unspecified angina pectoris: Secondary | ICD-10-CM

## 2020-08-02 DIAGNOSIS — T466X5A Adverse effect of antihyperlipidemic and antiarteriosclerotic drugs, initial encounter: Secondary | ICD-10-CM

## 2020-08-02 DIAGNOSIS — I208 Other forms of angina pectoris: Secondary | ICD-10-CM

## 2020-08-02 DIAGNOSIS — E782 Mixed hyperlipidemia: Secondary | ICD-10-CM | POA: Diagnosis not present

## 2020-08-02 MED ORDER — RIVAROXABAN 20 MG PO TABS: 20.0000 mg | ORAL_TABLET | Freq: Every day | ORAL | 1 refills | Status: DC

## 2020-08-02 NOTE — Patient Instructions (Addendum)
Your LDL cholesterol goal is < 70  You can cut your atorvastatin in half and take 10mg  each day. Keep an eye on your muscle and joint pain. If you do not notice any improvement in the next week or two, stop your atorvastatin for a few days and see if your symptoms improve. If they do, call , Pharmacist with Dr Aundra Millet at #[phone] and we can try you on a low dose of rosuvastatin (Crestor) which is typically tolerated better and will still keep your cholesterol at goal.   The cholesterol injections I mentioned are Praluent and Repatha. They are not statins and are typically tolerated well. They're given every 2 weeks into the fatty tissue of your stomach and lower your LDL cholesterol by about 60%  I sent in a refill for Xarelto to [phone] order pharmacy which should send you a 3 month supply for $86 compared to $129-141 at a local pharmacy.   When you are in the donut hole later this year, you can call [facility] and they can send you a 1 month supply of Xarelto for $85. Their phone # is [phone] and their website is [phone]  Look for a new Medicare Part D plan next year that doesn't have a deductible. The [employer] plan has a $0 deductible and offers anticoagulants for $90 for a 3 month supply

## 2020-08-28 ENCOUNTER — Telehealth: Payer: Self-pay | Admitting: Pharmacist

## 2020-08-28 ENCOUNTER — Other Ambulatory Visit: Payer: Self-pay | Admitting: Cardiology

## 2020-08-28 DIAGNOSIS — E782 Mixed hyperlipidemia: Secondary | ICD-10-CM

## 2020-08-28 NOTE — Telephone Encounter (Signed)
Called pt to follow up with atorvastatin tolerability. He feels much better on the lower 10mg  dose, myalgias have improved notably. Will continue 10mg  dose and recheck lipids in 3 months. Pt states he had received a 90 day supply of the 20mg  tablets and will be using up the supply for a while since he's cutting them in half. Will plan to send in new rx for lower 10mg  dose when pt comes in for follow up labs in July. Provided him with my # in case he needs new rx sent in before then.

## 2020-08-31 ENCOUNTER — Telehealth: Payer: Self-pay | Admitting: Pharmacist

## 2020-08-31 NOTE — Telephone Encounter (Signed)
Returned call to pt. 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 [employer], they stated that pt just needed to okay the $86 copay and that they also sent pt a letter advising that. Called pt back and told him to call the pharmacy to ok his rx to be sent, provided him with their # again. He was appreciative for assistance.

## 2020-08-31 NOTE — Telephone Encounter (Signed)
Patient called requesting to speak with [facility]. Aundra Millet unavailable will send her a message. Pt aware she will call him back at [phone]

## 2020-09-13 ENCOUNTER — Other Ambulatory Visit: Payer: Self-pay | Admitting: Cardiology

## 2020-12-04 ENCOUNTER — Other Ambulatory Visit: Payer: Self-pay

## 2020-12-04 ENCOUNTER — Other Ambulatory Visit: Payer: Medicare Other

## 2020-12-04 DIAGNOSIS — E782 Mixed hyperlipidemia: Secondary | ICD-10-CM

## 2020-12-04 LAB — LIPID PANEL
Chol/HDL Ratio: 2.8 ratio (ref 0.0–5.0)
Cholesterol, Total: 142 mg/dL (ref 100–199)
HDL: 51 mg/dL (ref >39)
LDL Chol Calc (NIH): 76 mg/dL (ref 0–99)
Triglycerides: 75 mg/dL (ref 0–149)
VLDL Cholesterol Cal: 15 mg/dL (ref 5–40)

## 2020-12-04 LAB — HEPATIC FUNCTION PANEL
ALT: 17 IU/L (ref 0–44)
AST: 18 IU/L (ref 0–40)
Albumin: 4.7 g/dL (ref 3.8–4.8)
Alkaline Phosphatase: 67 IU/L (ref 44–121)
Bilirubin Total: 0.4 mg/dL (ref 0.0–1.2)
Bilirubin, Direct: 0.12 mg/dL (ref 0.00–0.40)
Total Protein: 7.6 g/dL (ref 6.0–8.5)

## 2020-12-06 MED ORDER — ATORVASTATIN CALCIUM 10 MG PO TABS: 10.0000 mg | ORAL_TABLET | Freq: Every day | ORAL | 3 refills | Status: DC

## 2020-12-06 NOTE — Addendum Note (Signed)
Addended by: Redonna Wilbert E on: 12/06/2020 07:32 AM   Modules accepted: Orders

## 2021-01-24 NOTE — Progress Notes (Signed)
Cardiology Office Note    Date:  01/31/2021   ID:  Luis Adams, DOB 27-Dec-1950, MRN [id number]   PCP:  Kathlee NationsEason, Paul, MD   [facility]  Cardiologist:  Armanda Magicraci Turner, MD   Advanced Practice Provider:  No care team member to display Electrophysiologist:  None   [phone] [id number]   Chief Complaint  Patient presents with   Follow-up    History of Present Illness:  Luis Adams is a 70 y.o. male with history of PAF, HTN, DM, RBBB, Coronary CTA 04/19/20 Calcium score 2413 96th percentile for age and sex matched controlled, FFR possible prox-med RCAand distal LAD. Cath 05/2020 Prox RCA to Mid RCA lesion is 45% stenosed with 80% stenosed side branch in RV Branch, Mid RCA lesion is 85% stenosed, Dist LAD lesion is 40% stenosed with 80% stenosed side branch in 2nd Sept. He underwent extensive OCT guided orbital atherectomy with DES/PCI of ostial mRCA with overlapping DES x 3. Has had statin intolerance and followed by lipid clinic. Currently trying rosuvastatin. Plan for ASA/Plavix and Xarelto for 1 month then ASA every other day but stopped 08/09/20.  Last saw Dr. Mayford Knifeurner 07/21/20 and doing well.   Patient comes in for f/u. Has no energy since his stents. Was very active all his life but struggling to make himself do things. Was mowing this weekend and was laying on ground pulling up mowing deck and has been sore in left chest and shoulder since. Comes and goes and reproducible to touch. Walks his dog  5-6 times/day for about a mile. BP up today. Runs 125/70-145/75 at home. He says he worries about everything and has white coat syndrome. Lives in [place], [place]. Labs 01/02/21 [facility], Hgb 13.6, Crt 0.94, TSH normal A1C 5.7 all labs stable.  Sildenafil is not working anymore and he cannot afford revatio.  Past Medical History:  Diagnosis Date   AAA (abdominal aortic aneurysm) (HCC)    Arthritis    Benign essential HTN 07/13/2018   Habitual alcohol use     Hyperlipidemia    PAF (paroxysmal atrial fibrillation) (HCC) 07/13/2018   Prediabetes    RBBB     Past Surgical History:  Procedure Laterality Date   CENTRAL LINE INSERTION  07/12/2020   Procedure: CENTRAL LINE INSERTION;  Surgeon: Marykay LexHarding, David W, MD;  [facility];  Service: Cardiovascular;;   CORONARY ATHERECTOMY N/A 07/12/2020   Procedure: CORONARY ATHERECTOMY;  Surgeon: Marykay LexHarding, David W, MD;  [facility];  Service: Cardiovascular;  Laterality: N/A;   INTRAVASCULAR IMAGING/OCT N/A 07/12/2020   Procedure: INTRAVASCULAR IMAGING/OCT;  Surgeon: Marykay LexHarding, David W, MD;  [facility];  Service: Cardiovascular;  Laterality: N/A;   INTRAVASCULAR ULTRASOUND/IVUS N/A 06/23/2020   Procedure: Intravascular Ultrasound/IVUS;  Surgeon: Marykay LexHarding, David W, MD;  [facility];  Service: Cardiovascular;  Laterality: N/A;   LEFT HEART CATH AND CORONARY ANGIOGRAPHY N/A 06/23/2020   Procedure: LEFT HEART CATH AND CORONARY ANGIOGRAPHY;  Surgeon: Marykay LexHarding, David W, MD;  [facility];  Service: Cardiovascular;  Laterality: N/A;   TONSILLECTOMY      Current Medications: Current Meds  Medication Sig   amLODipine (NORVASC) 5 MG tablet Take 1.5 tablets (7.5 mg total) by mouth daily.   atorvastatin (LIPITOR) 10 MG tablet Take 1 tablet (10 mg total) by mouth daily.   carvedilol (COREG) 6.25 MG tablet Take 1 tablet by mouth twice daily   clopidogrel (PLAVIX) 75 MG  tablet Take 75 mg by mouth daily.   cyanocobalamin 1000 MCG tablet Take by mouth.   famotidine (PEPCID) 20 MG tablet Take by mouth.   nitroGLYCERIN (NITROSTAT) 0.4 MG SL tablet Place 1 tablet (0.4 mg total) under the tongue every 5 (five) minutes as needed for chest pain.   quinapril (ACCUPRIL) 40 MG tablet Take 1 tablet by mouth once daily   rivaroxaban (XARELTO) 20 MG TABS tablet Take 1 tablet (20 mg total) by mouth daily with supper.   sildenafil (REVATIO) 20 MG tablet Take 100  mg by mouth daily as needed (erectile dysfunction).    tadalafil (CIALIS) 10 MG tablet Take 1 tablet (10 mg total) by mouth daily as needed for erectile dysfunction.   Tetrahydrozoline HCl (VISINE OP) Place 1 drop into both eyes 3 (three) times daily as needed (irritation/ dry eyes).     Allergies:   Patient has no known allergies.   Social History   Socioeconomic History   Marital status: Married    Spouse name: Not on file   Number of children: Not on file   Years of education: Not on file   Highest education level: Not on file  Occupational History   Not on file  Tobacco Use   Smoking status: Never   Smokeless tobacco: Never  Vaping Use   Vaping Use: Never used  Substance and Sexual Activity   Alcohol use: Yes    Alcohol/week: 6.0 standard drinks    Types: 6 Cans of beer per week    Comment: reports drinking 1 can of beer daily   Drug use: Never   Sexual activity: Not on file  Other Topics Concern   Not on file  Social History Narrative   Not on file   Social Determinants of Health   Financial Resource Strain: Not on file  Food Insecurity: Not on file  Transportation Needs: Not on file  Physical Activity: Not on file  Stress: Not on file  Social Connections: Not on file     Family History:  The patient's  family history includes Cancer in his maternal grandmother. He was adopted.   ROS:   Please see the history of present illness.    ROS All other systems reviewed and are negative.   PHYSICAL EXAM:   VS:  BP (!) 160/92   Pulse 76   Ht 6' (1.829 m)   Wt 187 lb 12.8 oz (85.2 kg)   SpO2 95%   BMI 25.47 kg/m   Physical Exam  GEN: Well nourished, well developed, in no acute distress  Neck: no JVD, carotid bruits, or masses Cardiac:RRR; no murmurs, rubs, or gallops right arm pain reproducible to touch Respiratory:  clear to auscultation bilaterally, normal work of breathing GI: soft, nontender, nondistended, + BS Ext: without cyanosis, clubbing, or edema,  Good distal pulses bilaterally Neuro:  Alert and Oriented x 3Psych: euthymic mood, full affect  Wt Readings from Last 3 Encounters:  01/31/21 187 lb 12.8 oz (85.2 kg)  07/21/20 187 lb 6.4 oz (85 kg)  07/12/20 190 lb (86.2 kg)      Studies/Labs Reviewed:   EKG:  EKG is  ordered today.  The ekg ordered today demonstrates NSR with RBBB unchanged  Recent Labs: 07/10/2020: BUN 16; Creatinine, Ser 0.89; Hemoglobin 13.6; Platelets 192; Potassium 4.2; Sodium 137 12/04/2020: ALT 17   Lipid Panel    Component Value Date/Time   CHOL 142 12/04/2020 0807   TRIG 75 12/04/2020 0807   HDL 51  12/04/2020 0807   CHOLHDL 2.8 12/04/2020 0807   LDLCALC 76 12/04/2020 0807    Additional studies/ records that were reviewed today include:  Coronary CTA  03/2020 Aorta: Normal size. Scattered calcifications in the descending aorta. No dissection.   Aortic Valve:  Trileaflet.  No calcifications.   Coronary Arteries:  Normal coronary origin.  Right dominance.   RCA is a large dominant artery that gives rise to PDA and PLVB. There is mild calcified plaque in the proximal RCA with associated stenosis of 25-49% followed by moderate calcified plaque in the proximal RCA with associated stenosis of 50-69% but could be > 70%. There is mild calcified plaque in the mid RCA with associated stenosis of 25-49% and moderate calcified plaque in the distal RCA with associated stenosis of 50-69% but this may be overestimated due to significant blooming artifact as well as motion artifact.   Left main is a very short artery that gives rise to LAD and LCX arteries. There is no plaque.   LAD is a large vessel. There is moderate calcified plaque in the proximal and mid LAD with associated stenosis of 50-69% but may be overestimated due to blooming artifact. There is at least mild calcified plaque in the D1 but may be greater but motion and blooming artifact precludes accurate assessment.   LCX is a non-dominant  artery that gives rise to one large OM1 branch. There is mild calcified plaque in the proximal LCx with associated stenosis of 25-49%. The mid and distal LCx are not adequately visualized.   Other findings:   Normal pulmonary vein drainage into the left atrium.   Normal let atrial appendage without a thrombus.   Normal size of the pulmonary artery.   IMPRESSION: 1. Coronary calcium score of 2413. This was 96th percentile for age and sex matched control.   2.  Normal coronary origin with right dominance.   3.  Moderate atherosclerosis.  CAD RADs 3.   4. There is significant blooming artifact and the mid and distal LCx are not well visualized.   5. Consider symptom-guided anti-ischemic and preventive pharmacotherapy as well as risk factor modification per guideline-directed care.   6.  This study has been submitted for FFR flow analysis.   Cardiac Cath 06/23/2020 Conclusion     The left ventricular systolic function is normal. LV end diastolic pressure is normal. The left ventricular ejection fraction is 55-65% by visual estimate. There is no aortic valve stenosis. Prox RCA to Mid RCA lesion is 45% stenosed with 80% stenosed side branch in RV Branch. Mid RCA lesion is 85% stenosed. Dist LAD lesion is 40% stenosed with 80% stenosed side branch in 2nd Sept.   Severe heavily calcified proximal to mid RCA with extensive 40 to 60% calcified stenosis followed by a very focal area of 90% stenosis at a bend point. ->  IVUS performed of the proximal segment of the calcified area indicates eccentric heavily calcified plaque.  Unable to advance the catheter be to the major lesion. ->  Lesion will require atherectomy. Mild proximal LCx disease prior to giving off OM1, mild diffuse LAD disease with the most notable lesion being in the ostium of a major septal trunk/tandem LAD from the mid LAD (roughly 80%).  The follow-on LAD however gives off a diagonal branch and is relatively free of  disease. Poor quality LV gram, but appears to have normal wall motion/EF and normal EDP.     RECOMMENDATIONS Patient be discharged today after prolonged bedrest because  of additional heparin given for IVUS. He will be scheduled for orbital atherectomy based PCI of the RCA on Friday, 06/30/2021. He will restart Xarelto tonight at 8 PM He will loaded with Plavix (clopidogrel) 300 mg (4 tabs) on Wednesday, 06/28/2021, and then continue 75 mg twice daily   Coronary Intervention 07/12/2020 Conclusion     Target Lesion Site #1 prox RCA to Mid RCA lesion is 60% stenosed with 80% stenosed side branch in RV Branch. Mid RCA-1 lesion is 85% stenosed. Orbital atherectomy and predilation performed throughout this segment, 4 passes through the distal, 3 to the more proximal A drug-eluting stent was successfully placed using a STENT RESOLUTE ONYX 4.0X34. Postdilated to 4.3 mm Post intervention, there is a 0% residual stenosis. Ost RCA to Prox RCA lesion is 65% stenosed.-With OCT this appeared to be a disrupted area of plaque, potential guide catheter dissection. Decision was made to stent to cover it. A drug-eluting stent was successfully placed overlapping the initial stent to approximately and to the ostium of the RCA, using a STENT RESOLUTE ONYX 4.5X12. Postdilated to 4.7 mm including the overlapping segment. Post intervention, there is a 0% residual stenosis. Mid RCA-2 lesion is 85% stenosed. Likely distal edge tear from the nose cone of the atherectomy catheter. Covered with another stent. A drug-eluting stent was successfully placed overlapping distally to cover the lesion, using a STENT RESOLUTE ONYX 3.0X15. Postdilated and taper fashion from 4.3 to 3.3 mm. Post intervention, there is a 0% residual stenosis.   SUMMARY Successful extensive OCT guided orbital atherectomy with DES PCI of the ostial to mid RCA using 3 overlapping DES stents =>  Resolute Onyx DES 4.5 mm x 12 mm ostial (postdilated to 4.8  mm),  Resolute Onyx DES 4.0 mm x 34 mm) covering the major atherectomized segment (postdilated to 4.35 mm) =>  Overlapping distal with a Resolute Onyx DES 3.0 Mm 15 Mm (post dilation at the overlap to 4.3 mm.) RFV central venous line placed, but temp wire not required after the use of Aminophylline     RECOMMENDATIONS Restart Xarelto this evening at 8:00 PM Okay to do same-day discharge Aggressive risk factor modification, atorvastatin had not yet been started, I wrote for to start.  However, would have low threshold to be more aggressive in follow-up         Risk Assessment/Calculations:    CHA2DS2-VASc Score = 4   This indicates a 4.8% annual risk of stroke. The patient's score is based upon: CHF History: No HTN History: Yes Diabetes History: Yes Stroke History: No Vascular Disease History: Yes Age Score: 1 Gender Score: 0       ASSESSMENT:    1. Coronary artery disease involving native coronary artery of native heart without angina pectoris   2. Paroxysmal atrial fibrillation (HCC)   3. Hyperlipidemia, unspecified hyperlipidemia type   4. Essential hypertension   5. Type 2 diabetes mellitus with hyperosmolarity without coma, without long-term current use of insulin (HCC)   6. AAA (abdominal aortic aneurysm) without rupture (HCC)      PLAN:  In order of problems listed above:  CAD status post extensive OCT guided orbital atherectomy with DES/PCI of ostial mid RCA with overlapping DES x3 currently on Plavix aspirin stopped 07/2020.  Patient's biggest complaint is fatigue since his stents were placed.  No chest pain.  He did lift a heavy mower deck to clean under it and has soreness in his left arm shoulder and chest that is reproducible with touch  and movement.  EKG today unchanged.  Suspect this is all musculoskeletal.  No bleeding problems on Plavix and Xarelto.  Recent labs by PCP were all stable.  Scanned into the chart.  PAF on Xarelto and carvedilol  HLD  followed by lipid clinic many statin intolerance is currently on rosuvastatin  Hypertension on quinapril, amlodipine and carvedilol.  Blood pressure running high today but has been stable at home.  Patient does have whitecoat syndrome.  DM type II followed by PCP diet controlled A1c 5.7 last month  AAA-details unclear followed by PCP  Erectile dysfunction.  Generic sildenafil is not working and name brand is too expensive.  We will try Cialis 10 mg as needed.  Instructed that he cannot use nitroglycerin for 48 hours after use.   Shared Decision Making/Informed Consent        Medication Adjustments/Labs and Tests Ordered: Current medicines are reviewed at length with the patient today.  Concerns regarding medicines are outlined above.  Medication changes, Labs and Tests ordered today are listed in the Patient Instructions below. Patient Instructions  Medication Instructions:  Your physician has recommended you make the following change in your medication:  START CIALIS 10 MG AS NEEDED. *If you need a refill on your cardiac medications before your next appointment, please call your pharmacy*   Lab Work: NONE If you have labs (blood work) drawn today and your tests are completely normal, you will receive your results only by: MyChart Message (if you have MyChart) OR A paper copy in the mail If you have any lab test that is abnormal or we need to change your treatment, we will call you to review the results.   Testing/Procedures: NONE   Follow-Up: At [facility], you and your health needs are our priority.  As part of our continuing mission to provide you with exceptional heart care, we have created designated Provider Care Teams.  These Care Teams include your primary Cardiologist (physician) and Advanced Practice Providers (APPs -  Physician Assistants and Nurse Practitioners) who all work together to provide you with the care you need, when you need it.  We recommend signing  up for the patient portal called "MyChart".  Sign up information is provided on this After Visit Summary.  MyChart is used to connect with patients for Virtual Visits (Telemedicine).  Patients are able to view lab/test results, encounter notes, upcoming appointments, etc.  Non-urgent messages can be sent to your provider as well.   To learn more about what you can do with MyChart, go to [email or web address].    Your next appointment:   6 month(s)  The format for your next appointment:   In Person  Provider:   You may see Armanda Magic, MD or one of the following Advanced Practice Providers on your designated Care Team:   Ronie Spies, PA-C Jacolyn Reedy, PA-C   Signed, Jacolyn Reedy, PA-C  01/31/2021 12:18 PM    [facility] [address] [place], [place], [place]  [address] Phone: [phone]; Fax: [phone]

## 2021-01-31 ENCOUNTER — Encounter: Payer: Self-pay | Admitting: Physician Assistant

## 2021-01-31 ENCOUNTER — Ambulatory Visit (INDEPENDENT_AMBULATORY_CARE_PROVIDER_SITE_OTHER): Payer: Medicare Other | Admitting: Physician Assistant

## 2021-01-31 ENCOUNTER — Other Ambulatory Visit: Payer: Self-pay

## 2021-01-31 VITALS — BP 160/92 | HR 76 | Ht 72.0 in | Wt 187.8 lb

## 2021-01-31 DIAGNOSIS — I714 Abdominal aortic aneurysm, without rupture, unspecified: Secondary | ICD-10-CM

## 2021-01-31 DIAGNOSIS — E1100 Type 2 diabetes mellitus with hyperosmolarity without nonketotic hyperglycemic-hyperosmolar coma (NKHHC): Secondary | ICD-10-CM

## 2021-01-31 DIAGNOSIS — I480 Paroxysmal atrial fibrillation: Secondary | ICD-10-CM | POA: Diagnosis not present

## 2021-01-31 DIAGNOSIS — E785 Hyperlipidemia, unspecified: Secondary | ICD-10-CM | POA: Diagnosis not present

## 2021-01-31 DIAGNOSIS — I2510 Atherosclerotic heart disease of native coronary artery without angina pectoris: Secondary | ICD-10-CM

## 2021-01-31 DIAGNOSIS — I208 Other forms of angina pectoris: Secondary | ICD-10-CM | POA: Diagnosis not present

## 2021-01-31 DIAGNOSIS — I10 Essential (primary) hypertension: Secondary | ICD-10-CM | POA: Diagnosis not present

## 2021-01-31 MED ORDER — TADALAFIL 10 MG PO TABS: 10.0000 mg | ORAL_TABLET | Freq: Every day | ORAL | 1 refills | Status: AC | PRN

## 2021-01-31 NOTE — Patient Instructions (Addendum)
Medication Instructions:  Your physician has recommended you make the following change in your medication:  START CIALIS 10 MG AS NEEDED. *If you need a refill on your cardiac medications before your next appointment, please call your pharmacy*   Lab Work: NONE If you have labs (blood work) drawn today and your tests are completely normal, you will receive your results only by: MyChart Message (if you have MyChart) OR A paper copy in the mail If you have any lab test that is abnormal or we need to change your treatment, we will call you to review the results.   Testing/Procedures: NONE   Follow-Up: At [facility], you and your health needs are our priority.  As part of our continuing mission to provide you with exceptional heart care, we have created designated Provider Care Teams.  These Care Teams include your primary Cardiologist (physician) and Advanced Practice Providers (APPs -  Physician Assistants and Nurse Practitioners) who all work together to provide you with the care you need, when you need it.  We recommend signing up for the patient portal called "MyChart".  Sign up information is provided on this After Visit Summary.  MyChart is used to connect with patients for Virtual Visits (Telemedicine).  Patients are able to view lab/test results, encounter notes, upcoming appointments, etc.  Non-urgent messages can be sent to your provider as well.   To learn more about what you can do with MyChart, go to [email or web address].    Your next appointment:   6 month(s)  The format for your next appointment:   In Person  Provider:   You may see Armanda Magic, MD or one of the following Advanced Practice Providers on your designated Care Team:   Ronie Spies, PA-C Jacolyn Reedy, PA-C

## 2021-03-13 ENCOUNTER — Telehealth: Payer: Self-pay | Admitting: Pharmacist

## 2021-03-13 MED ORDER — RIVAROXABAN 20 MG PO TABS: 20.0000 mg | ORAL_TABLET | Freq: Every day | ORAL | 0 refills | Status: DC

## 2021-03-13 NOTE — Telephone Encounter (Addendum)
Pt called clinic concerned about Xarelto copay while in the donut hole. I applied for [employer] for pt while on the phone with him, he did provide me with credit card info to finish enrollment online so that rx will be sent sooner. Rx has been sent to [facility] who will fill 3 month supply of Xarelto for $240. Pt aware to contact them at [phone] if he doesn't hear from them within the next few days.

## 2021-05-15 ENCOUNTER — Other Ambulatory Visit: Payer: Self-pay | Admitting: Cardiology

## 2021-05-28 ENCOUNTER — Other Ambulatory Visit: Payer: Self-pay | Admitting: Cardiology

## 2021-05-28 DIAGNOSIS — E785 Hyperlipidemia, unspecified: Secondary | ICD-10-CM

## 2021-06-20 ENCOUNTER — Other Ambulatory Visit: Payer: Self-pay | Admitting: Pharmacist

## 2021-06-20 MED ORDER — RIVAROXABAN 20 MG PO TABS: 20.0000 mg | ORAL_TABLET | Freq: Every day | ORAL | 1 refills | Status: DC

## 2021-07-23 ENCOUNTER — Other Ambulatory Visit: Payer: Self-pay | Admitting: Cardiology

## 2021-07-24 ENCOUNTER — Other Ambulatory Visit: Payer: Self-pay | Admitting: Cardiology

## 2021-07-24 ENCOUNTER — Other Ambulatory Visit: Payer: Self-pay | Admitting: *Deleted

## 2021-07-24 ENCOUNTER — Telehealth: Payer: Self-pay | Admitting: Cardiology

## 2021-07-24 MED ORDER — CLOPIDOGREL BISULFATE 75 MG PO TABS: 75.0000 mg | ORAL_TABLET | Freq: Every day | ORAL | 0 refills | Status: DC

## 2021-07-24 NOTE — Telephone Encounter (Signed)
° ° °*  STAT* If patient is at the pharmacy, call can be transferred to refill team.   1. Which medications need to be refilled? (please list name of each medication and dose if known) clopidogrel (PLAVIX) 75 MG tablet  2. Which pharmacy/location (including street and city if local pharmacy) is medication to be sent to? [facility] [address] [place] - [address]  3. Do they need a 30 day or 90 day supply? 30 days  Pt is out of meds needs refill today

## 2021-08-15 ENCOUNTER — Telehealth: Payer: Self-pay

## 2021-08-15 ENCOUNTER — Ambulatory Visit (INDEPENDENT_AMBULATORY_CARE_PROVIDER_SITE_OTHER): Payer: Medicare Other | Admitting: Cardiology

## 2021-08-15 ENCOUNTER — Other Ambulatory Visit: Payer: Self-pay

## 2021-08-15 ENCOUNTER — Encounter: Payer: Self-pay | Admitting: Cardiology

## 2021-08-15 VITALS — BP 160/84 | HR 59 | Ht 72.0 in | Wt 191.8 lb

## 2021-08-15 DIAGNOSIS — I25119 Atherosclerotic heart disease of native coronary artery with unspecified angina pectoris: Secondary | ICD-10-CM | POA: Diagnosis not present

## 2021-08-15 DIAGNOSIS — E1100 Type 2 diabetes mellitus with hyperosmolarity without nonketotic hyperglycemic-hyperosmolar coma (NKHHC): Secondary | ICD-10-CM | POA: Diagnosis not present

## 2021-08-15 DIAGNOSIS — I7141 Pararenal abdominal aortic aneurysm, without rupture: Secondary | ICD-10-CM

## 2021-08-15 DIAGNOSIS — I10 Essential (primary) hypertension: Secondary | ICD-10-CM | POA: Diagnosis not present

## 2021-08-15 DIAGNOSIS — I480 Paroxysmal atrial fibrillation: Secondary | ICD-10-CM

## 2021-08-15 DIAGNOSIS — E7800 Pure hypercholesterolemia, unspecified: Secondary | ICD-10-CM

## 2021-08-15 MED ORDER — ASPIRIN EC 81 MG PO TBEC: 81.0000 mg | DELAYED_RELEASE_TABLET | Freq: Every day | ORAL | 3 refills | Status: AC

## 2021-08-15 NOTE — Progress Notes (Signed)
?Cardiology Office Note:   ? ?Date:  08/15/2021  ? ?ID:  Luis BeginDaniel Pralle, DOB June 01, 1950, MRN [id number] ? ?PCP:  Kathlee NationsEason, Paul, MD  ?Cardiologist:  Armanda Magicraci Patryck Kilgore, MD   ? ?Referring MD: Kathlee NationsEason, Paul, MD  ? ?Chief Complaint  ?Patient presents with  ? Coronary Artery Disease  ? Hypertension  ? Hyperlipidemia  ? Atrial Fibrillation  ? ? ?History of Present Illness:   ? ?Luis Adams is a 71 y.o. male with a hx of paroxysmal atrial fibrillation, AAA (details unclear, followed by PCP), essential HTN, DM, RBBB, habitual alcohol.  2D echo 07/22/18 showed EF 55-60%, mildly enlarged RV.  Nuc 07/22/18 was normal, EF 53%. He developed some dizziness with metoprolol (with normal HR and BP per report) so was switched to diltiazem. ?  ?Coronary CTA showing significant ASCAD and cath 05/2020 showed prox RCA to Mid RCA lesion is 45% stenosed with 80% stenosed side branch in RV Branch, Mid RCA lesion 85% stenosed, Dist LAD lesion 40% stenosed with 80% stenosed side branch.  He underwent extensive OCT guided orbital atherectomy with DES/PCI of ostial mRCA with overlapping DES x3.  ? ?He is here today for followup and is doing well.  he denies any chest pain or pressure, SOB, DOE, PND, orthopnea, LE edema, dizziness, palpitations or syncope. He is compliant with his meds and is tolerating meds with no SE.   He walks at least 1/2 mile daily with no problems.  ? ?Past Medical History:  ?Diagnosis Date  ? AAA (abdominal aortic aneurysm)   ? Arthritis   ? Benign essential HTN 07/13/2018  ? Habitual alcohol use   ? Hyperlipidemia   ? PAF (paroxysmal atrial fibrillation) (HCC) 07/13/2018  ? Prediabetes   ? RBBB   ? ? ?Past Surgical History:  ?Procedure Laterality Date  ? CENTRAL LINE INSERTION  07/12/2020  ? Procedure: CENTRAL LINE INSERTION;  Surgeon: Marykay LexHarding, David W, MD;  [facility];  Service: Cardiovascular;;  ? CORONARY ATHERECTOMY N/A 07/12/2020  ? Procedure: CORONARY ATHERECTOMY;  Surgeon: Marykay LexHarding, David W, MD;  [facility];  Service: Cardiovascular;  Laterality: N/A;  ? INTRAVASCULAR IMAGING/OCT N/A 07/12/2020  ? Procedure: INTRAVASCULAR IMAGING/OCT;  Surgeon: Marykay LexHarding, David W, MD;  [facility];  Service: Cardiovascular;  Laterality: N/A;  ? INTRAVASCULAR ULTRASOUND/IVUS N/A 06/23/2020  ? Procedure: Intravascular Ultrasound/IVUS;  Surgeon: Marykay LexHarding, David W, MD;  [facility];  Service: Cardiovascular;  Laterality: N/A;  ? LEFT HEART CATH AND CORONARY ANGIOGRAPHY N/A 06/23/2020  ? Procedure: LEFT HEART CATH AND CORONARY ANGIOGRAPHY;  Surgeon: Marykay LexHarding, David W, MD;  [facility];  Service: Cardiovascular;  Laterality: N/A;  ? TONSILLECTOMY    ? ? ?Current Medications: ?Current Meds  ?Medication Sig  ? amLODipine (NORVASC) 5 MG tablet Take 1.5 tablets (7.5 mg total) by mouth daily.  ? atorvastatin (LIPITOR) 10 MG tablet Take 1 tablet (10 mg total) by mouth daily.  ? carvedilol (COREG) 6.25 MG tablet Take 1 tablet by mouth twice daily  ? clopidogrel (PLAVIX) 75 MG tablet Take 1 tablet (75 mg total) by mouth daily. Please keep upcoming appointment in March 2023 for future refills. Thank you  ? cyanocobalamin 1000 MCG tablet Take by mouth.  ? famotidine (PEPCID) 20 MG tablet Take by mouth.  ? lisinopril (ZESTRIL) 40 MG tablet Take 40 mg by mouth daily.  ? nitroGLYCERIN (NITROSTAT) 0.4 MG SL tablet PLACE 1 TABLET UNDER THE TONGUE EVERY 5 MINUTES  AS NEEDED FOR CHEST PAIN  ? rivaroxaban (XARELTO) 20 MG TABS tablet Take 1 tablet (20 mg total) by mouth daily with supper.  ? sildenafil (REVATIO) 20 MG tablet Take 100 mg by mouth daily as needed (erectile dysfunction).   ? tadalafil (CIALIS) 10 MG tablet Take 1 tablet (10 mg total) by mouth daily as needed for erectile dysfunction.  ? Tetrahydrozoline HCl (VISINE OP) Place 1 drop into both eyes 3 (three) times daily as needed (irritation/ dry eyes).  ? [DISCONTINUED] quinapril (ACCUPRIL) 40 MG tablet Take 1 tablet by mouth once daily  ?   ? ?Allergies:   Patient has no known allergies.  ? ?Social History  ? ?Socioeconomic History  ? Marital status: Married  ?  Spouse name: Not on file  ? Number of children: Not on file  ? Years of education: Not on file  ? Highest education level: Not on file  ?Occupational History  ? Not on file  ?Tobacco Use  ? Smoking status: Never  ? Smokeless tobacco: Never  ?Vaping Use  ? Vaping Use: Never used  ?Substance and Sexual Activity  ? Alcohol use: Yes  ?  Alcohol/week: 6.0 standard drinks  ?  Types: 6 Cans of beer per week  ?  Comment: reports drinking 1 can of beer daily  ? Drug use: Never  ? Sexual activity: Not on file  ?Other Topics Concern  ? Not on file  ?Social History Narrative  ? Not on file  ? ?Social Determinants of Health  ? ?Financial Resource Strain: Not on file  ?Food Insecurity: Not on file  ?Transportation Needs: Not on file  ?Physical Activity: Not on file  ?Stress: Not on file  ?Social Connections: Not on file  ?  ? ?Family History: ?The patient's family history includes Cancer in his maternal grandmother. He was adopted. ? ?ROS:   ?Please see the history of present illness.    ?ROS  ?All other systems reviewed and negative.  ? ?EKGs/Labs/Other Studies Reviewed:   ? ?The following studies were reviewed today: ? ?Coronary CTA  03/2020 ?Aorta: Normal size. Scattered calcifications in the descending ?aorta. No dissection. ?  ?Aortic Valve:  Trileaflet.  No calcifications. ?  ?Coronary Arteries:  Normal coronary origin.  Right dominance. ?  ?RCA is a large dominant artery that gives rise to PDA and PLVB. ?There is mild calcified plaque in the proximal RCA with associated ?stenosis of 25-49% followed by moderate calcified plaque in the ?proximal RCA with associated stenosis of 50-69% but could be > 70%. ?There is mild calcified plaque in the mid RCA with associated ?stenosis of 25-49% and moderate calcified plaque in the distal RCA ?with associated stenosis of 50-69% but this may be overestimated  due ?to significant blooming artifact as well as motion artifact. ?  ?Left main is a very short artery that gives rise to LAD and LCX ?arteries. There is no plaque. ?  ?LAD is a large vessel. There is moderate calcified plaque in the ?proximal and mid LAD with associated stenosis of 50-69% but may be ?overestimated due to blooming artifact. There is at least mild ?calcified plaque in the D1 but may be greater but motion and ?blooming artifact precludes accurate assessment. ?  ?LCX is a non-dominant artery that gives rise to one large OM1 ?branch. There is mild calcified plaque in the proximal LCx with ?associated stenosis of 25-49%. The mid and distal LCx are not ?adequately visualized. ?  ?Other findings: ?  ?Normal pulmonary  vein drainage into the left atrium. ?  ?Normal let atrial appendage without a thrombus. ?  ?Normal size of the pulmonary artery. ?  ?IMPRESSION: ?1. Coronary calcium score of 2413. This was 96th percentile for age ?and sex matched control. ?  ?2.  Normal coronary origin with right dominance. ?  ?3.  Moderate atherosclerosis.  CAD RADs 3. ?  ?4. There is significant blooming artifact and the mid and distal LCx ?are not well visualized. ?  ?5. Consider symptom-guided anti-ischemic and preventive ?pharmacotherapy as well as risk factor modification per ?guideline-directed care. ?  ?6.  This study has been submitted for FFR flow analysis. ?  ?Cardiac Cath 06/23/2020 ?Conclusion ? ?  ?The left ventricular systolic function is normal. ?LV end diastolic pressure is normal. ?The left ventricular ejection fraction is 55-65% by visual estimate. ?There is no aortic valve stenosis. ?Prox RCA to Mid RCA lesion is 45% stenosed with 80% stenosed side branch in RV Branch. ?Mid RCA lesion is 85% stenosed. ?Dist LAD lesion is 40% stenosed with 80% stenosed side branch in 2nd Sept. ?  ?Severe heavily calcified proximal to mid RCA with extensive 40 to 60% calcified stenosis followed by a very focal area of 90%  stenosis at a bend point. ->  IVUS performed of the proximal segment of the calcified area indicates eccentric heavily calcified plaque.  Unable to advance the catheter be to the major lesion. ->  Lesion will r

## 2021-08-15 NOTE — Addendum Note (Signed)
Addended by: Theresia Majors on: 08/15/2021 11:35 AM ? ? Modules accepted: Orders ? ?

## 2021-08-15 NOTE — Patient Instructions (Signed)
Medication Instructions:  ?Your physician has recommended you make the following change in your medication:  ?1) STOP taking Plavix (clopidogrel) ?2) START taking Aspirin 81 mg daily  ? ?*If you need a refill on your cardiac medications before your next appointment, please call your pharmacy* ? ?Follow-Up: ?At [facility], you and your health needs are our priority.  As part of our continuing mission to provide you with exceptional heart care, we have created designated Provider Care Teams.  These Care Teams include your primary Cardiologist (physician) and Advanced Practice Providers (APPs -  Physician Assistants and Nurse Practitioners) who all work together to provide you with the care you need, when you need it. ? ?Your next appointment:   ?6 month(s) ? ?The format for your next appointment:   ?In Person ? ?Provider:   ?Armanda Magic, MD   ? ? ? ?  ?

## 2021-08-15 NOTE — Telephone Encounter (Signed)
**Note De-Identified Jamiyla Ishee Obfuscation** The pt had an office visit with Dr Mayford Knife today and expressed concerns about Xarelto cost. ? ?I called the pt to discuss but got no answer so I left a VM asking him to call Larita Fife at Dr [place] office at [facility] at [phone]. ?

## 2021-08-16 ENCOUNTER — Telehealth: Payer: Self-pay | Admitting: Cardiology

## 2021-08-16 NOTE — Telephone Encounter (Signed)
**Note De-Identified Athaliah Baumbach Obfuscation** The pt states that he paid $864/90 day supply in February and is not sure if he has met his deductible yet this year so his next refill maybe expensive as wel. ?He is also concerned that when he falls into his donut hole in [place] that he will not be able to afford. ? ?We discussed [employer] pt asst but the pt stated that he has already s/w them and was advised that he is not eligible because he has ins coverage. ? ?We discussed [employer] but the pt states that they advised him that tey are not taking applications anymore. ? ?I explained that [employer] does not require an application and that all he will need to do is to call them at [phone] to enroll in the Xarelto discount program where he can get his Xarelto for $85 a month or $240 for 3 months. ? ?He insisted that he has called them and was advised that they no longer offer this program. ? ?We discussed switching him to Warfarin but he stated he was not interested in taking "rat poison"  at all. ? ?I strongly recommended that he call [employer] as to my knowledge this program is still available without having to apply but he will need to enroll. ? ?He states that he will call them and will call me back if [employer] is not an option for him. ?

## 2021-08-16 NOTE — Telephone Encounter (Signed)
Patient called to talk with Dr. Mayford Knife or nurse. Patient says that he it was important questions he needed to ask ?

## 2021-08-16 NOTE — Telephone Encounter (Signed)
Spoke with the patient who states he is returning a call from [place] in regards to his xarelto.  ?

## 2021-08-16 NOTE — Telephone Encounter (Signed)
**Note De-Identified Luis Adams Obfuscation** See f/u phone call on 3/23. ?

## 2021-08-17 NOTE — Telephone Encounter (Signed)
Patient wanted to let Larita Fife know that he spoke with Marshell Levan again after he spoke with her. Leane Para said that they will reach out to the patient in April to see if he qualifies for their program. He appreciated all all of Lynn's help ?

## 2021-08-21 ENCOUNTER — Other Ambulatory Visit: Payer: Self-pay | Admitting: Cardiology

## 2021-09-14 ENCOUNTER — Telehealth: Payer: Self-pay | Admitting: Cardiology

## 2021-09-14 NOTE — Telephone Encounter (Signed)
? ?  Pre-operative Risk Assessment  ?  ?Patient Name: Luis Adams  ?DOB: 1950/06/11 ?MRN: [id number]  ? ?  ? ?Request for Surgical Clearance   ? ?Procedure:  Upper Endoscopy and Colonoscopy ? ?Date of Surgery:  Clearance 09/28/21                              ?   ?Surgeon:  Dr. Kathryne Hitch  ?Surgeon's Group or Practice Name:  [facility]  ?Phone number:  [phone] ?Fax number:  [phone] ?  ?Type of Clearance Requested:   ?- Medical  ?- Pharmacy:  Hold Rivaroxaban (Xarelto) 3 days  ?  ?Type of Anesthesia:  Not Indicated ?  ?Additional requests/questions:   ? ?Signed, ?Johnna Acosta   ?09/14/2021, 3:38 PM   ?

## 2021-09-17 ENCOUNTER — Telehealth: Payer: Self-pay | Admitting: *Deleted

## 2021-09-17 NOTE — Telephone Encounter (Signed)
Pt agreeable to plan of care for tele pre op appt 09/18/21 @ 10:40 per pt request for time slot.  ? ?  ?Patient Consent for Virtual Visit  ? ? ?   ? ?Luis Adams has provided verbal consent on 09/17/2021 for a virtual visit (video or telephone). ? ? ?CONSENT FOR VIRTUAL VISIT FOR:  Luis Adams  ?By participating in this virtual visit I agree to the following: ? ?I hereby voluntarily request, consent and authorize [facility] and its employed or contracted physicians, physician assistants, nurse practitioners or other licensed health care professionals (the Practitioner), to provide me with telemedicine health care services (the ?Services") as deemed necessary by the treating Practitioner. I acknowledge and consent to receive the Services by the Practitioner via telemedicine. I understand that the telemedicine visit will involve communicating with the Practitioner through live audiovisual communication technology and the disclosure of certain medical information by electronic transmission. I acknowledge that I have been given the opportunity to request an in-person assessment or other available alternative prior to the telemedicine visit and am voluntarily participating in the telemedicine visit. ? ?I understand that I have the right to withhold or withdraw my consent to the use of telemedicine in the course of my care at any time, without affecting my right to future care or treatment, and that the Practitioner or I may terminate the telemedicine visit at any time. I understand that I have the right to inspect all information obtained and/or recorded in the course of the telemedicine visit and may receive copies of available information for a reasonable fee.  I understand that some of the potential risks of receiving the Services via telemedicine include:  ?Delay or interruption in medical evaluation due to technological equipment failure or disruption; ?Information transmitted may not be sufficient (e.g. poor  resolution of images) to allow for appropriate medical decision making by the Practitioner; and/or  ?In rare instances, security protocols could fail, causing a breach of personal health information. ? ?Furthermore, I acknowledge that it is my responsibility to provide information about my medical history, conditions and care that is complete and accurate to the best of my ability. I acknowledge that Practitioner's advice, recommendations, and/or decision may be based on factors not within their control, such as incomplete or inaccurate data provided by me or distortions of diagnostic images or specimens that may result from electronic transmissions. I understand that the practice of medicine is not an exact science and that Practitioner makes no warranties or guarantees regarding treatment outcomes. I acknowledge that a copy of this consent can be made available to me via my patient portal [facility] MyChart), or I can request a printed copy by calling the office of [facility].   ? ?I understand that my insurance will be billed for this visit.  ? ?I have read or had this consent read to me. ?I understand the contents of this consent, which adequately explains the benefits and risks of the Services being provided via telemedicine.  ?I have been provided ample opportunity to ask questions regarding this consent and the Services and have had my questions answered to my satisfaction. ?I give my informed consent for the services to be provided through the use of telemedicine in my medical care ? ? ? ?

## 2021-09-17 NOTE — Telephone Encounter (Signed)
? ? ?  Name: Luis Adams  ?DOB: 18-Apr-1951  ?MRN: [id number] ? ?Primary Cardiologist: Armanda Magic, MD ? ? ?Preoperative team, please contact this patient and set up a phone call appointment for further preoperative risk assessment. Please obtain consent and complete medication review. Thank you for your help. ? ?I confirm that guidance regarding antiplatelet and oral anticoagulation therapy has been completed and, if necessary, noted below. ? ?Per office protocol, patient can hold Xarelto for 2 days prior to procedure. ? ? ?Marcelino Duster, PA ?09/17/2021, 8:44 AM ?[facility] ?[address] ?[place], [place] [address] ? ? ?

## 2021-09-17 NOTE — Telephone Encounter (Signed)
Left message to call for tele pre op appt 

## 2021-09-17 NOTE — Telephone Encounter (Signed)
Pt agreeable to plan of care for tele pre op appt 09/18/21 @ 10:40 per pt request for time slot.  ?

## 2021-09-17 NOTE — Telephone Encounter (Signed)
Patient with diagnosis of afib on Xarelto for anticoagulation.   ? ?Procedure: Upper Endoscopy and Colonoscopy ?  ?Date of procedure: 09/28/21 ? ?CHA2DS2-VASc Score = 4  ? This indicates a 4.8% annual risk of stroke. ?The patient's score is based upon: ?CHF History: 0 ?HTN History: 1 ?Diabetes History: 1 ?Stroke History: 0 ?Vascular Disease History: 1 ?Age Score: 1 ?Gender Score: 0 ?  ?  ? ?CrCl 81.7 ml/min ? ?Per office protocol, patient can hold Xarelto for 2 days prior to procedure.   ? ? ?

## 2021-09-18 ENCOUNTER — Ambulatory Visit (INDEPENDENT_AMBULATORY_CARE_PROVIDER_SITE_OTHER): Payer: Medicare Other | Admitting: Physician Assistant

## 2021-09-18 DIAGNOSIS — Z01810 Encounter for preprocedural cardiovascular examination: Secondary | ICD-10-CM

## 2021-09-18 DIAGNOSIS — I480 Paroxysmal atrial fibrillation: Secondary | ICD-10-CM

## 2021-09-18 NOTE — Progress Notes (Signed)
? ?Virtual Visit via Telephone Note  ? ?This visit type was conducted due to national recommendations for restrictions regarding the COVID-19 Pandemic (e.g. social distancing) in an effort to limit this patient's exposure and mitigate transmission in our community.  Due to his co-morbid illnesses, this patient is at least at moderate risk for complications without adequate follow up.  This format is felt to be most appropriate for this patient at this time.  The patient did not have access to video technology/had technical difficulties with video requiring transitioning to audio format only (telephone).  All issues noted in this document were discussed and addressed.  No physical exam could be performed with this format.  Please refer to the patient's chart for his  consent to telehealth for [facility]. ? ?Evaluation Performed:  Preoperative cardiovascular risk assessment ?_____________  ? ?Date:  09/18/2021  ? ?Patient ID:  Luis Adams, DOB [birth year], MRN [id number] ?Patient Location:  ?Home ?Provider location:   ?Office ? ?Primary Care Provider:  Kathlee Nations, MD ?Primary Cardiologist:  Armanda Magic, MD ? ?Chief Complaint  ?  ?71 y.o. y/o male with a h/o paroxysmal atrial fibrillation, AAA (details unclear, followed by PCP), essential HTN, DM, RBBB, habitual alcohol, who is pending Upper Endoscopy and Colonoscopy, and presents today for telephonic preoperative cardiovascular risk assessment. ? ?Past Medical History  ?  ?Past Medical History:  ?Diagnosis Date  ? AAA (abdominal aortic aneurysm)   ? Arthritis   ? Benign essential HTN 07/13/2018  ? Habitual alcohol use   ? Hyperlipidemia   ? PAF (paroxysmal atrial fibrillation) (HCC) 07/13/2018  ? Prediabetes   ? RBBB   ? ?Past Surgical History:  ?Procedure Laterality Date  ? CENTRAL LINE INSERTION  07/12/2020  ? Procedure: CENTRAL LINE INSERTION;  Surgeon: Marykay Lex, MD;  Location[facility] INVASIVE CV LAB;  Service: Cardiovascular;;  ? CORONARY ATHERECTOMY N/A  07/12/2020  ? Procedure: CORONARY ATHERECTOMY;  Surgeon: Marykay Lex, MD;  Location: [facility] INVASIVE CV LAB;  Service: Cardiovascular;  Laterality: N/A;  ? INTRAVASCULAR IMAGING/OCT N/A 07/12/2020  ? Procedure: INTRAVASCULAR IMAGING/OCT;  Surgeon: Marykay Lex, MD;  Location: [facility] INVASIVE CV LAB;  Service: Cardiovascular;  Laterality: N/A;  ? INTRAVASCULAR ULTRASOUND/IVUS N/A 06/23/2020  ? Procedure: Intravascular Ultrasound/IVUS;  Surgeon: Marykay Lex, MD;  Location: [facility] INVASIVE CV LAB;  Service: Cardiovascular;  Laterality: N/A;  ? LEFT HEART CATH AND CORONARY ANGIOGRAPHY N/A 06/23/2020  ? Procedure: LEFT HEART CATH AND CORONARY ANGIOGRAPHY;  Surgeon: Marykay Lex, MD;  Location: [facility] INVASIVE CV LAB;  Service: Cardiovascular;  Laterality: N/A;  ? TONSILLECTOMY    ? ? ?Allergies ? ?No Known Allergies ? ?History of Present Illness  ?  ?Luis Adams is a 71 y.o. male who presents via audio/video conferencing for a telehealth visit today.  Pt was last seen in cardiology clinic on 08/15/21 by Dr. Mayford Knife.  At that time Kolston Lacount was doing well .  The patient is now pending EGD and colonoscopy.  Since his last visit, he well. ? ?The patient denies nausea, vomiting, fever, chest pain, palpitations, shortness of breath, orthopnea, PND, dizziness, syncope, cough, congestion, abdominal pain, hematochezia, melena, lower extremity edema. No exertional limitations.  ? ?Home Medications  ?  ?Prior to Admission medications   ?Medication Sig Start Date End Date Taking? Authorizing Provider  ?amLODipine (NORVASC) 5 MG tablet Take 1.5 tablets (7.5 mg total) by mouth daily. 03/31/20   Quintella Reichert, MD  ?aspirin EC 81 MG tablet Take  1 tablet (81 mg total) by mouth daily. Swallow whole. 08/15/21   Quintella Reichert, MD  ?atorvastatin (LIPITOR) 10 MG tablet Take 1 tablet (10 mg total) by mouth daily. 12/06/20   Quintella Reichert, MD  ?carvedilol (COREG) 6.25 MG tablet Take 1 tablet by mouth twice daily 08/22/21   Quintella Reichert,  MD  ?cyanocobalamin 1000 MCG tablet Take by mouth. 08/25/20   [provider]  ?famotidine (PEPCID) 20 MG tablet Take by mouth. 08/25/20   [provider]  ?lisinopril (ZESTRIL) 40 MG tablet Take 40 mg by mouth daily. 06/05/21   [provider]  ?nitroGLYCERIN (NITROSTAT) 0.4 MG SL tablet PLACE 1 TABLET UNDER THE TONGUE EVERY 5 MINUTES AS NEEDED FOR CHEST PAIN 05/15/21   Quintella Reichert, MD  ?rivaroxaban (XARELTO) 20 MG TABS tablet Take 1 tablet (20 mg total) by mouth daily with supper. 06/20/21   Quintella Reichert, MD  ?sildenafil (REVATIO) 20 MG tablet Take 100 mg by mouth daily as needed (erectile dysfunction).  04/17/18   [provider]  ?tadalafil (CIALIS) 10 MG tablet Take 1 tablet (10 mg total) by mouth daily as needed for erectile dysfunction. ?Patient not taking: Reported on 09/17/2021 01/31/21   Dyann Kief, PA-C  ?Tetrahydrozoline HCl (VISINE OP) Place 1 drop into both eyes 3 (three) times daily as needed (irritation/ dry eyes).    [provider]  ? ? ?Physical Exam  ?  ?Vital Signs:  Zackery Brine does not have vital signs available for review today. ? ?Given telephonic nature of communication, physical exam is limited. ?AAOx3. NAD. Normal affect.  Speech and respirations are unlabored. ? ?Accessory Clinical Findings  ?  ?None ? ?Assessment & Plan  ?  ?1.  Preoperative Cardiovascular Risk Assessment: ? ?Chart reviewed as part of pre-operative protocol coverage. Given past medical history and time since last visit, based on ACC/AHA guidelines, Fong Mccarry would be at acceptable risk for the planned procedure without further cardiovascular testing.  ? ?Per office protocol, patient can hold Xarelto for 2 days prior to procedure. ? ?A copy of this note will be routed to requesting surgeon. ? ?Time:   ?Today, I have spent 5 minutes with the patient with telehealth technology discussing medical history, symptoms, and management plan.   ? ? ?Manson Passey,  [place] ? ?09/18/2021, 10:48 AM  ?

## 2021-09-22 ENCOUNTER — Other Ambulatory Visit: Payer: Self-pay | Admitting: Cardiology

## 2021-12-23 ENCOUNTER — Other Ambulatory Visit: Payer: Self-pay | Admitting: Cardiology

## 2021-12-23 DIAGNOSIS — I480 Paroxysmal atrial fibrillation: Secondary | ICD-10-CM

## 2021-12-24 ENCOUNTER — Telehealth: Payer: Self-pay | Admitting: Pharmacist

## 2021-12-24 MED ORDER — RIVAROXABAN 20 MG PO TABS: 20.0000 mg | ORAL_TABLET | Freq: Every day | ORAL | 1 refills | Status: DC

## 2021-12-24 NOTE — Telephone Encounter (Signed)
Xarelto 20mg  refill request received. Pt is 71 years old, weight-87kg, Crea-1.05 on 07/10/2021 via [facility] from [facility], last seen by [place], [place] on 09/18/2021, Diagnosis-Afib, CrCl-81.50ml/min; Dose is appropriate based on dosing criteria. Will send in refill to requested pharmacy.

## 2021-12-24 NOTE — Telephone Encounter (Signed)
Patient called needing Rx for [facility]. Rx sent

## 2022-01-31 ENCOUNTER — Encounter: Payer: Self-pay | Admitting: Cardiology

## 2022-01-31 ENCOUNTER — Ambulatory Visit: Payer: Medicare Other | Attending: Cardiology | Admitting: Cardiology

## 2022-01-31 VITALS — BP 142/84 | HR 74 | Ht 72.0 in | Wt 193.4 lb

## 2022-01-31 DIAGNOSIS — I25119 Atherosclerotic heart disease of native coronary artery with unspecified angina pectoris: Secondary | ICD-10-CM | POA: Diagnosis not present

## 2022-01-31 DIAGNOSIS — I714 Abdominal aortic aneurysm, without rupture, unspecified: Secondary | ICD-10-CM | POA: Diagnosis present

## 2022-01-31 DIAGNOSIS — I10 Essential (primary) hypertension: Secondary | ICD-10-CM | POA: Insufficient documentation

## 2022-01-31 DIAGNOSIS — E7800 Pure hypercholesterolemia, unspecified: Secondary | ICD-10-CM | POA: Diagnosis present

## 2022-01-31 DIAGNOSIS — I480 Paroxysmal atrial fibrillation: Secondary | ICD-10-CM | POA: Diagnosis present

## 2022-01-31 MED ORDER — ATORVASTATIN CALCIUM 20 MG PO TABS: 20.0000 mg | ORAL_TABLET | Freq: Every day | ORAL | 3 refills | Status: DC

## 2022-01-31 NOTE — Progress Notes (Signed)
Cardiology Office Note:    Date:  01/31/2022   ID:  Luis Adams, DOB 02-13-1951, MRN [id number]  PCP:  Kathlee Nations, MD  Cardiologist:  Armanda Magic, MD    Referring MD: Kathlee Nations, MD   Chief Complaint  Patient presents with   Atrial Fibrillation   Hypertension   Hyperlipidemia   Coronary Artery Disease    History of Present Illness:    Luis Adams is a 71 y.o. male with a hx of paroxysmal atrial fibrillation, AAA (details unclear, followed by PCP), essential HTN, DM, RBBB, habitual alcohol.  2D echo 07/22/18 showed EF 55-60%, mildly enlarged RV.  Nuc 07/22/18 was normal, EF 53%. He developed some dizziness with metoprolol (with normal HR and BP per report) so was switched to diltiazem.   Coronary CTA showing significant ASCAD and cath 05/2020 showed prox RCA to Mid RCA lesion is 45% stenosed with 80% stenosed side branch in RV Branch, Mid RCA lesion 85% stenosed, Dist LAD lesion 40% stenosed with 80% stenosed side branch.  He underwent extensive OCT guided orbital atherectomy with DES/PCI of ostial mRCA with overlapping DES x3.   He is here today for followup and is doing well.  He has had some DOE when he is out working outside in the extreme heat and humidity and will feel SOB that quickly resolves when he goes inside. He denies any chest pain or pressure, PND, orthopnea, LE edema, dizziness, palpitations or syncope. He is compliant with his meds and is tolerating meds with no SE.    Past Medical History:  Diagnosis Date   AAA (abdominal aortic aneurysm) (HCC)    Arthritis    Benign essential HTN 07/13/2018   Habitual alcohol use    Hyperlipidemia    PAF (paroxysmal atrial fibrillation) (HCC) 07/13/2018   Prediabetes    RBBB     Past Surgical History:  Procedure Laterality Date   CENTRAL LINE INSERTION  07/12/2020   Procedure: CENTRAL LINE INSERTION;  Surgeon: Marykay Lex, MD;  Location: [facility] INVASIVE CV LAB;  Service: Cardiovascular;;   CORONARY ATHERECTOMY N/A  07/12/2020   Procedure: CORONARY ATHERECTOMY;  Surgeon: Marykay Lex, MD;  Location: [facility] INVASIVE CV LAB;  Service: Cardiovascular;  Laterality: N/A;   INTRAVASCULAR IMAGING/OCT N/A 07/12/2020   Procedure: INTRAVASCULAR IMAGING/OCT;  Surgeon: Marykay Lex, MD;  Location: [facility] INVASIVE CV LAB;  Service: Cardiovascular;  Laterality: N/A;   INTRAVASCULAR ULTRASOUND/IVUS N/A 06/23/2020   Procedure: Intravascular Ultrasound/IVUS;  Surgeon: Marykay Lex, MD;  Location: [facility] INVASIVE CV LAB;  Service: Cardiovascular;  Laterality: N/A;   LEFT HEART CATH AND CORONARY ANGIOGRAPHY N/A 06/23/2020   Procedure: LEFT HEART CATH AND CORONARY ANGIOGRAPHY;  Surgeon: Marykay Lex, MD;  Location: [facility] INVASIVE CV LAB;  Service: Cardiovascular;  Laterality: N/A;   TONSILLECTOMY      Current Medications: Current Meds  Medication Sig   amLODipine (NORVASC) 5 MG tablet Take 1.5 tablets (7.5 mg total) by mouth daily.   aspirin EC 81 MG tablet Take 1 tablet (81 mg total) by mouth daily. Swallow whole.   atorvastatin (LIPITOR) 20 MG tablet Take 1 tablet (20 mg total) by mouth daily.   carvedilol (COREG) 6.25 MG tablet Take 1 tablet by mouth twice daily   cyanocobalamin 1000 MCG tablet Take by mouth.   famotidine (PEPCID) 20 MG tablet Take by mouth.   lisinopril (ZESTRIL) 40 MG tablet Take 40 mg by mouth daily.   nitroGLYCERIN (NITROSTAT) 0.4 MG SL tablet PLACE 1 TABLET  UNDER THE TONGUE EVERY 5 MINUTES AS NEEDED FOR CHEST PAIN   pantoprazole (PROTONIX) 40 MG tablet Take 40 mg by mouth daily.   rivaroxaban (XARELTO) 20 MG TABS tablet Take 1 tablet (20 mg total) by mouth daily with supper.   sildenafil (REVATIO) 20 MG tablet Take 100 mg by mouth daily as needed (erectile dysfunction).    Tetrahydrozoline HCl (VISINE OP) Place 1 drop into both eyes 3 (three) times daily as needed (irritation/ dry eyes).   [DISCONTINUED] atorvastatin (LIPITOR) 10 MG tablet Take 1 tablet (10 mg total) by mouth daily.     Allergies:    Patient has no known allergies.   Social History   Socioeconomic History   Marital status: Married    Spouse name: Not on file   Number of children: Not on file   Years of education: Not on file   Highest education level: Not on file  Occupational History   Not on file  Tobacco Use   Smoking status: Never   Smokeless tobacco: Never  Vaping Use   Vaping Use: Never used  Substance and Sexual Activity   Alcohol use: Yes    Alcohol/week: 6.0 standard drinks of alcohol    Types: 6 Cans of beer per week    Comment: reports drinking 1 can of beer daily   Drug use: Never   Sexual activity: Not on file  Other Topics Concern   Not on file  Social History Narrative   Not on file   Social Determinants of Health   Financial Resource Strain: Not on file  Food Insecurity: Not on file  Transportation Needs: Not on file  Physical Activity: Not on file  Stress: Not on file  Social Connections: Not on file     Family History: The patient's family history includes Cancer in his maternal grandmother. He was adopted.  ROS:   Please see the history of present illness.    ROS  All other systems reviewed and negative.   EKGs/Labs/Other Studies Reviewed:    The following studies were reviewed today:  Coronary CTA  03/2020 Aorta: Normal size. Scattered calcifications in the descending aorta. No dissection.   Aortic Valve:  Trileaflet.  No calcifications.   Coronary Arteries:  Normal coronary origin.  Right dominance.   RCA is a large dominant artery that gives rise to PDA and PLVB. There is mild calcified plaque in the proximal RCA with associated stenosis of 25-49% followed by moderate calcified plaque in the proximal RCA with associated stenosis of 50-69% but could be > 70%. There is mild calcified plaque in the mid RCA with associated stenosis of 25-49% and moderate calcified plaque in the distal RCA with associated stenosis of 50-69% but this may be overestimated due to  significant blooming artifact as well as motion artifact.   Left main is a very short artery that gives rise to LAD and LCX arteries. There is no plaque.   LAD is a large vessel. There is moderate calcified plaque in the proximal and mid LAD with associated stenosis of 50-69% but may be overestimated due to blooming artifact. There is at least mild calcified plaque in the D1 but may be greater but motion and blooming artifact precludes accurate assessment.   LCX is a non-dominant artery that gives rise to one large OM1 branch. There is mild calcified plaque in the proximal LCx with associated stenosis of 25-49%. The mid and distal LCx are not adequately visualized.   Other findings:  Normal pulmonary vein drainage into the left atrium.   Normal let atrial appendage without a thrombus.   Normal size of the pulmonary artery.   IMPRESSION: 1. Coronary calcium score of 2413. This was 96th percentile for age and sex matched control.   2.  Normal coronary origin with right dominance.   3.  Moderate atherosclerosis.  CAD RADs 3.   4. There is significant blooming artifact and the mid and distal LCx are not well visualized.   5. Consider symptom-guided anti-ischemic and preventive pharmacotherapy as well as risk factor modification per guideline-directed care.   6.  This study has been submitted for FFR flow analysis.   Cardiac Cath 06/23/2020 Conclusion    The left ventricular systolic function is normal. LV end diastolic pressure is normal. The left ventricular ejection fraction is 55-65% by visual estimate. There is no aortic valve stenosis. Prox RCA to Mid RCA lesion is 45% stenosed with 80% stenosed side branch in RV Branch. Mid RCA lesion is 85% stenosed. Dist LAD lesion is 40% stenosed with 80% stenosed side branch in 2nd Sept.   Severe heavily calcified proximal to mid RCA with extensive 40 to 60% calcified stenosis followed by a very focal area of 90% stenosis at  a bend point. ->  IVUS performed of the proximal segment of the calcified area indicates eccentric heavily calcified plaque.  Unable to advance the catheter be to the major lesion. ->  Lesion will require atherectomy. Mild proximal LCx disease prior to giving off OM1, mild diffuse LAD disease with the most notable lesion being in the ostium of a major septal trunk/tandem LAD from the mid LAD (roughly 80%).  The follow-on LAD however gives off a diagonal branch and is relatively free of disease. Poor quality LV gram, but appears to have normal wall motion/EF and normal EDP.     RECOMMENDATIONS Patient be discharged today after prolonged bedrest because of additional heparin given for IVUS. He will be scheduled for orbital atherectomy based PCI of the RCA on Friday, 06/30/2021. He will restart Xarelto tonight at 8 PM He will loaded with Plavix (clopidogrel) 300 mg (4 tabs) on Wednesday, 06/28/2021, and then continue 75 mg twice daily   Coronary Intervention 07/12/2020 Conclusion    Target Lesion Site #1 prox RCA to Mid RCA lesion is 60% stenosed with 80% stenosed side branch in RV Branch. Mid RCA-1 lesion is 85% stenosed. Orbital atherectomy and predilation performed throughout this segment, 4 passes through the distal, 3 to the more proximal A drug-eluting stent was successfully placed using a STENT RESOLUTE ONYX 4.0X34. Postdilated to 4.3 mm Post intervention, there is a 0% residual stenosis. Ost RCA to Prox RCA lesion is 65% stenosed.-With OCT this appeared to be a disrupted area of plaque, potential guide catheter dissection. Decision was made to stent to cover it. A drug-eluting stent was successfully placed overlapping the initial stent to approximately and to the ostium of the RCA, using a STENT RESOLUTE ONYX 4.5X12. Postdilated to 4.7 mm including the overlapping segment. Post intervention, there is a 0% residual stenosis. Mid RCA-2 lesion is 85% stenosed. Likely distal edge tear from the nose  cone of the atherectomy catheter. Covered with another stent. A drug-eluting stent was successfully placed overlapping distally to cover the lesion, using a STENT RESOLUTE ONYX 3.0X15. Postdilated and taper fashion from 4.3 to 3.3 mm. Post intervention, there is a 0% residual stenosis.   SUMMARY Successful extensive OCT guided orbital atherectomy with DES PCI of the  ostial to mid RCA using 3 overlapping DES stents =>  Resolute Onyx DES 4.5 mm x 12 mm ostial (postdilated to 4.8 mm),  Resolute Onyx DES 4.0 mm x 34 mm) covering the major atherectomized segment (postdilated to 4.35 mm) =>  Overlapping distal with a Resolute Onyx DES 3.0 Mm 15 Mm (post dilation at the overlap to 4.3 mm.) RFV central venous line placed, but temp wire not required after the use of Aminophylline     RECOMMENDATIONS Restart Xarelto this evening at 8:00 PM Okay to do same-day discharge Aggressive risk factor modification, atorvastatin had not yet been started, I wrote for to start.  However, would have low threshold to be more aggressive in follow-up    EKG:  EKG is ordered today and demonstrates NSR with RBBB unchanged from 2022  Recent Labs: No results found for requested labs within last 365 days.   Recent Lipid Panel    Component Value Date/Time   CHOL 142 12/04/2020 0807   TRIG 75 12/04/2020 0807   HDL 51 12/04/2020 0807   CHOLHDL 2.8 12/04/2020 0807   LDLCALC 76 12/04/2020 0807    HYPERTENSION CONTROL Vitals:   01/31/22 1400 01/31/22 1414  BP: (!) 148/88 (!) 142/84    The patient's blood pressure is elevated above target today.  In order to address the patient's elevated BP: The blood pressure is usually elevated in clinic.  Blood pressures monitored at home have been optimal.; Blood pressure will be monitored at home to determine if medication changes need to be made.      Physical Exam:    VS:  BP (!) 142/84   Pulse 74   Ht 6' (1.829 m)   Wt 193 lb 6.4 oz (87.7 kg)   SpO2 97%   BMI  26.23 kg/m     Wt Readings from Last 3 Encounters:  01/31/22 193 lb 6.4 oz (87.7 kg)  08/15/21 191 lb 12.8 oz (87 kg)  01/31/21 187 lb 12.8 oz (85.2 kg)    GEN: Well nourished, well developed in no acute distress HEENT: Normal NECK: No JVD; No carotid bruits LYMPHATICS: No lymphadenopathy CARDIAC:RRR, no murmurs, rubs, gallops RESPIRATORY:  Clear to auscultation without rales, wheezing or rhonchi  ABDOMEN: Soft, non-tender, non-distended MUSCULOSKELETAL:  No edema; No deformity  SKIN: Warm and dry NEUROLOGIC:  Alert and oriented x 3 PSYCHIATRIC:  Normal affect  ASSESSMENT:    1. Paroxysmal atrial fibrillation (HCC)   2. Benign essential HTN   3. Abdominal aortic aneurysm (AAA) without rupture, unspecified part ([employer])   4. Pure hypercholesterolemia   5. Coronary artery disease involving native coronary artery of native heart with angina pectoris ([place])    PLAN:    In order of problems listed above:  PAF -He is maintaining NSR with no palpitations -continue prescription drug management with Xarelto 20mg  daily and Carvedilol 6.25mg  BID with PRN refills -he denies any bleeding on the DOAC -I have personally reviewed and interpreted outside labs performed by patient's PCP which showed SCr 1.09 and Hbg 13.4 on 01/13/22  HTN -BP borderline controlled on exam today>>he has had problems with white coat HTN in the past -continue prescription drug management with Amlodipine 7.5mg  daily, Carvedilol 6.25mg  BID and Lisinopril 40mg  daily with PRN refills -I have personally reviewed and interpreted outside labs performed by patient's PCP which showed SCr 1.09 and K+ 4.6 on 01/13/22 -I have asked him to check his BP daily for a week and call with results  AAA -followed by  PCP  HLD -LDL goal < 50 with DM, CAD and AAA -I have personally reviewed and interpreted outside labs performed by patient's PCP which showed  LDL 84, HDL 49 on 01/13/22 -increase Atorvastatin to 20mg  daily and repeat FLP  and ALT in 6 weeks  ASCAD -coronary CTA showed at least moderate obstructive CAD -cath 06/2020 showed Prox RCA to Mid RCA lesion is 45% stenosed with 80% stenosed side branch in RV Branch, Mid RCA lesion is 85% stenosed, Dist LAD lesion is 40% stenosed with 80% stenosed side branch in 2nd Sept. -s/p extensive OCT guided orbital atherectomy with DES/PCI of ostial mRCA with overlapping DES x3.  -he denies any anginal symptoms -continue prescription drug management with ASA 81mg  daily, Carvedilol 6.25mg  BID and Atorvastatin 10mg  daily with PRn refills  Folowup with me in 1 year    Medication Adjustments/Labs and Tests Ordered: Current medicines are reviewed at length with the patient today.  Concerns regarding medicines are outlined above.  Orders Placed This Encounter  Procedures   ALT   Lipid panel   EKG 12-Lead   Meds ordered this encounter  Medications   atorvastatin (LIPITOR) 20 MG tablet    Sig: Take 1 tablet (20 mg total) by mouth daily.    Dispense:  90 tablet    Refill:  3    Signed, Fransico Him, MD  01/31/2022 2:19 PM    [facility]

## 2022-01-31 NOTE — Patient Instructions (Addendum)
Check your blood pressure daily for one week and send a MyChart message with a list of your readings.   Medication Instructions:  Your physician has recommended you make the following change in your medication: 1) INCREASE Lipitor (atorvastatin) to 20 mg daily *If you need a refill on your cardiac medications before your next appointment, please call your pharmacy*  Lab Work: Fasting lipids and ALT in 6 weeks If you have labs (blood work) drawn today and your tests are completely normal, you will receive your results only by: MyChart Message (if you have MyChart) OR A paper copy in the mail If you have any lab test that is abnormal or we need to change your treatment, we will call you to review the results.  Follow-Up: At [facility], you and your health needs are our priority.  As part of our continuing mission to provide you with exceptional heart care, we have created designated Provider Care Teams.  These Care Teams include your primary Cardiologist (physician) and Advanced Practice Providers (APPs -  Physician Assistants and Nurse Practitioners) who all work together to provide you with the care you need, when you need it.  Your next appointment:   1 year(s)  The format for your next appointment:   In Person  Provider:   Armanda Magic, MD     Important Information About Sugar

## 2022-02-07 ENCOUNTER — Encounter: Payer: Self-pay | Admitting: Cardiology

## 2022-02-14 ENCOUNTER — Ambulatory Visit: Payer: Medicare Other | Admitting: Cardiology

## 2022-03-18 ENCOUNTER — Ambulatory Visit: Payer: Medicare Other | Attending: Cardiology

## 2022-03-18 DIAGNOSIS — I10 Essential (primary) hypertension: Secondary | ICD-10-CM

## 2022-03-18 DIAGNOSIS — I480 Paroxysmal atrial fibrillation: Secondary | ICD-10-CM

## 2022-03-18 DIAGNOSIS — I25119 Atherosclerotic heart disease of native coronary artery with unspecified angina pectoris: Secondary | ICD-10-CM

## 2022-03-18 DIAGNOSIS — I714 Abdominal aortic aneurysm, without rupture, unspecified: Secondary | ICD-10-CM

## 2022-03-18 DIAGNOSIS — E7800 Pure hypercholesterolemia, unspecified: Secondary | ICD-10-CM

## 2022-03-18 LAB — LIPID PANEL
Chol/HDL Ratio: 2.4 ratio (ref 0.0–5.0)
Cholesterol, Total: 141 mg/dL (ref 100–199)
HDL: 60 mg/dL (ref >39)
LDL Chol Calc (NIH): 60 mg/dL (ref 0–99)
Triglycerides: 122 mg/dL (ref 0–149)
VLDL Cholesterol Cal: 21 mg/dL (ref 5–40)

## 2022-03-18 LAB — ALT: ALT: 15 IU/L (ref 0–44)

## 2022-07-17 IMAGING — CT CT HEART MORP W/ CTA COR W/ SCORE W/ CA W/CM &/OR W/O CM
1 of 8 series · 9 of 20 positions shown, 12 images · IV contrast (APPLIED)
Comparison: None.
COMPARISON: None.

Addendum:
EXAM:
OVER-READ INTERPRETATION  CT CHEST

The following report is an over-read performed by radiologist Dr.
Dambar Dilsad [REDACTED] on 04/19/2020. This
over-read does not include interpretation of cardiac or coronary
anatomy or pathology. The coronary CTA interpretation by the
cardiologist is attached.
TECHNIQUE: The patient was scanned on a Phillips Force scanner.

[Series 10: multiphase · axial · 0.39mm/px · z∈[+1232,+1332]mm · 9 of 1863 slices shown, 12 images]
[im 187/1863  vessel]
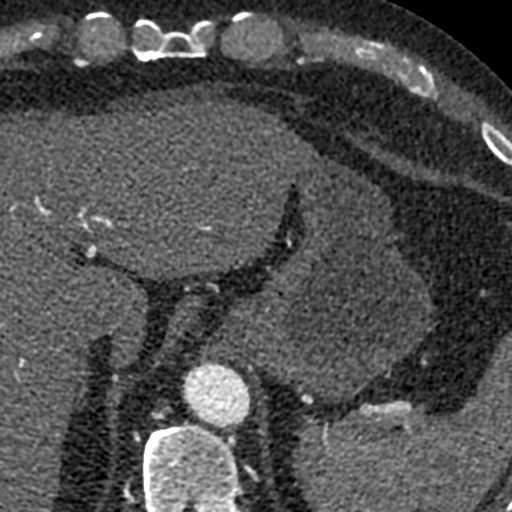
[im 187/1863  lung]
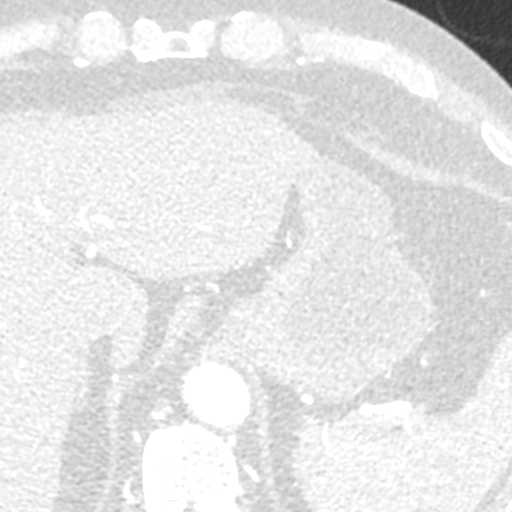
[im 373/1863  vessel]
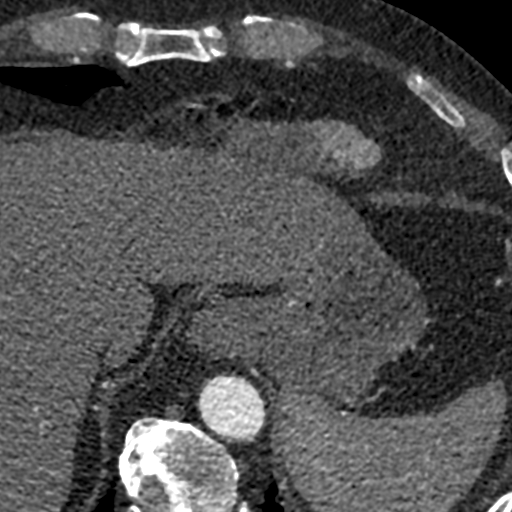
[im 559/1863  vessel]
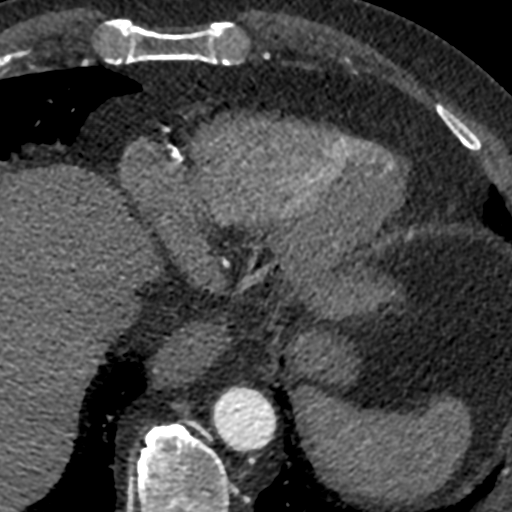
[im 745/1863  vessel]
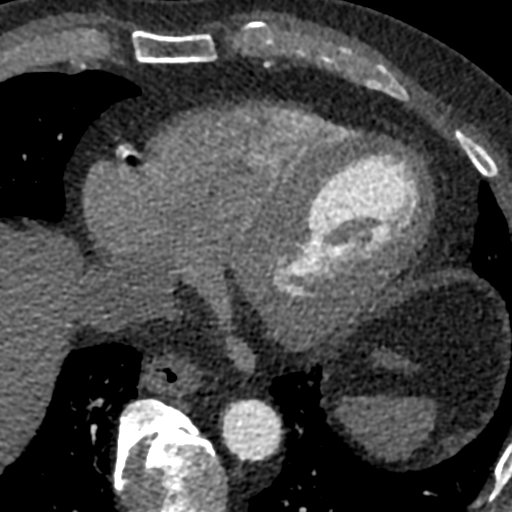
[im 932/1863  vessel]
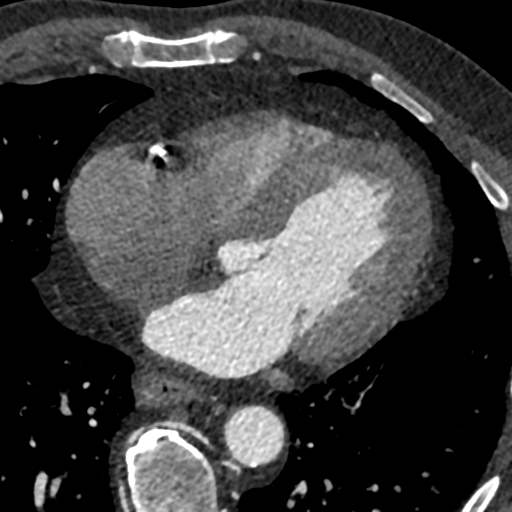
[im 932/1863  lung]
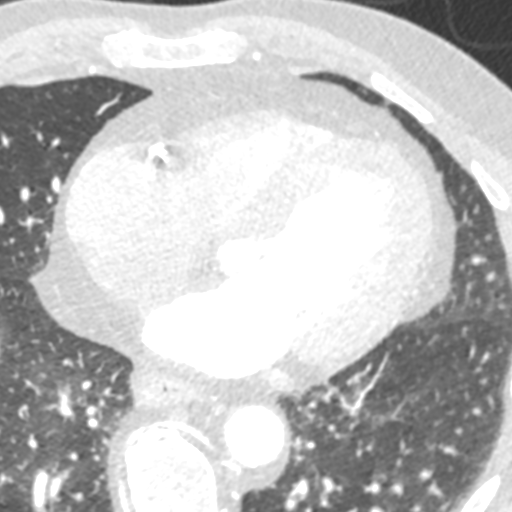
[im 1118/1863  vessel]
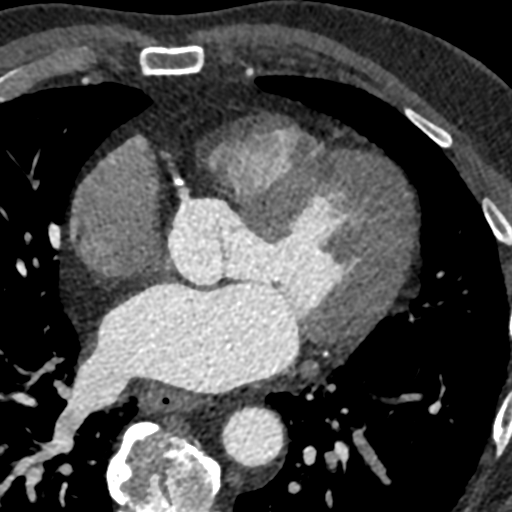
[im 1304/1863  vessel]
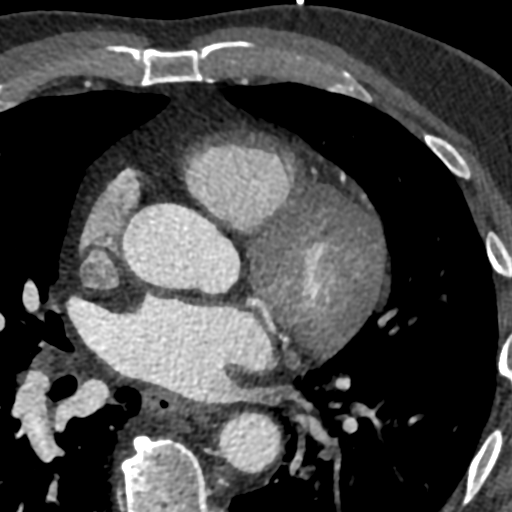
[im 1490/1863  vessel]
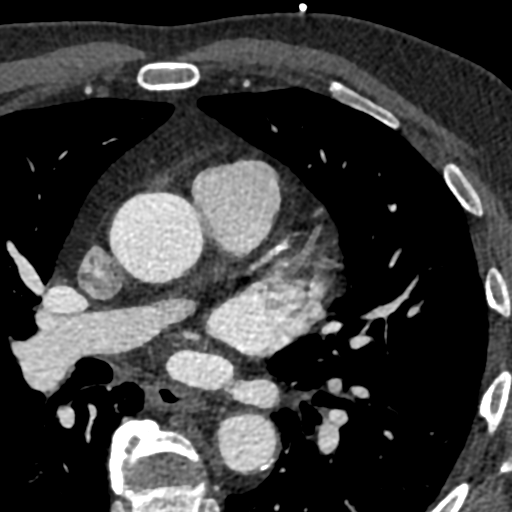
[im 1676/1863  vessel]
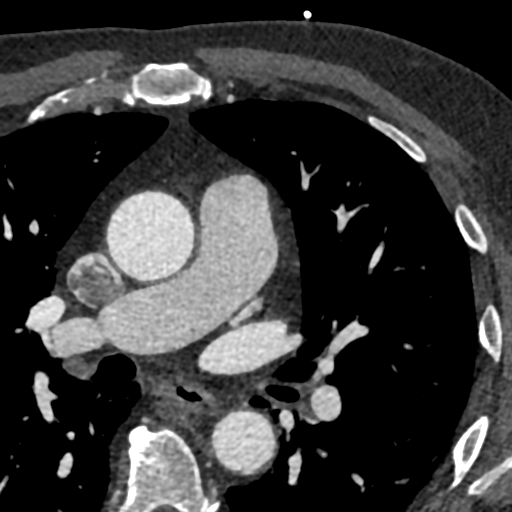
[im 1676/1863  lung]
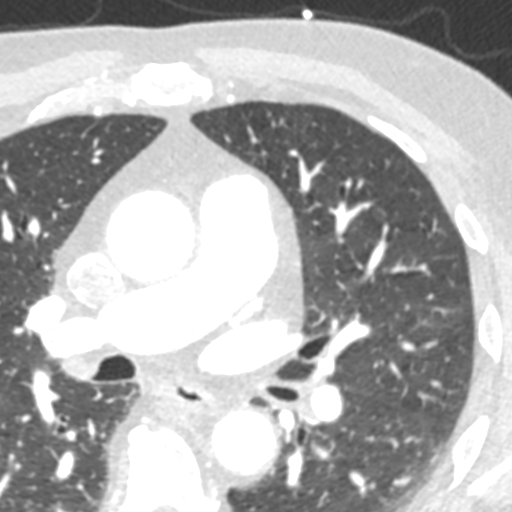

[9 of 20 positions shown; findings below may reference images not displayed]

FINDINGS: Vascular: Heart is normal size. Aorta normal caliber. Scattered
calcifications in the descending thoracic aorta.

Mediastinum/Nodes: No adenopathy.

Lungs/Pleura: No confluent opacities or effusions.

Upper Abdomen: Imaging into the upper abdomen demonstrates no acute
findings.

Musculoskeletal: Chest wall soft tissues are unremarkable. No acute
bony abnormality.
IMPRESSION: No acute or significant extracardiac abnormality.

EXAM:
Cardiac/Coronary  CT
FINDINGS: A 120 kV prospective scan was triggered in the descending thoracic
aorta at 111 HU's. Axial non-contrast 3 mm slices were carried out
through the heart. The data set was analyzed on a dedicated work
station and scored using the Agatson method. Gantry rotation speed
was 250 msecs and collimation was .6 mm. No beta blockade and 0.8 mg
of sl NTG was given. The 3D data set was reconstructed in 5%
intervals of the 67-82 % of the R-R cycle. Diastolic phases were
analyzed on a dedicated work station using MPR, MIP and VRT modes.
The patient received 80 cc of contrast.

Aorta: Normal size. Scattered calcifications in the descending
aorta. No dissection.

Aortic Valve:  Trileaflet.  No calcifications.

Coronary Arteries:  Normal coronary origin.  Right dominance.

RCA is a large dominant artery that gives rise to PDA and PLVB.
There is mild calcified plaque in the proximal RCA with associated
stenosis of 25-49% followed by moderate calcified plaque in the
proximal RCA with associated stenosis of 50-69% but could be > 70%.
There is mild calcified plaque in the mid RCA with associated
stenosis of 25-49% and moderate calcified plaque in the distal RCA
with associated stenosis of 50-69% but this may be overestimated due
to significant blooming artifact as well as motion artifact.

Left main is a very short artery that gives rise to LAD and LCX
arteries. There is no plaque.

LAD is a large vessel. There is moderate calcified plaque in the
proximal and mid LAD with associated stenosis of 50-69% but may be
overestimated due to blooming artifact. There is at least mild
calcified plaque in the D1 but may be greater but motion and
blooming artifact precludes accurate assessment.

LCX is a non-dominant artery that gives rise to one large OM1
branch. There is mild calcified plaque in the proximal LCx with
associated stenosis of 25-49%. The mid and distal LCx are not
adequately visualized.

Other findings:

Normal pulmonary vein drainage into the left atrium.

Normal let atrial appendage without a thrombus.

Normal size of the pulmonary artery.
IMPRESSION: 1. Coronary calcium score of 1532. This was 96th percentile for age
and sex matched control.

2.  Normal coronary origin with right dominance.

3.  Moderate atherosclerosis.  CAD RADs 3.

4. There is significant blooming artifact and the mid and distal LCx
are not well visualized.

5. Consider symptom-guided anti-ischemic and preventive
pharmacotherapy as well as risk factor modification per
guideline-directed care.

6.  This study has been submitted for FFR flow analysis.

Jayant Ogburn

*** End of Addendum ***
EXAM:
OVER-READ INTERPRETATION  CT CHEST

The following report is an over-read performed by radiologist Dr.
Dambar Dilsad [REDACTED] on 04/19/2020. This
over-read does not include interpretation of cardiac or coronary
anatomy or pathology. The coronary CTA interpretation by the
cardiologist is attached.
FINDINGS: Vascular: Heart is normal size. Aorta normal caliber. Scattered
calcifications in the descending thoracic aorta.

Mediastinum/Nodes: No adenopathy.

Lungs/Pleura: No confluent opacities or effusions.

Upper Abdomen: Imaging into the upper abdomen demonstrates no acute
findings.

Musculoskeletal: Chest wall soft tissues are unremarkable. No acute
bony abnormality.
IMPRESSION: No acute or significant extracardiac abnormality.

## 2022-08-26 ENCOUNTER — Other Ambulatory Visit: Payer: Self-pay | Admitting: Cardiology

## 2022-09-13 ENCOUNTER — Telehealth: Payer: Self-pay | Admitting: Cardiology

## 2022-09-13 NOTE — Telephone Encounter (Signed)
*  STAT* If patient is at the pharmacy, call can be transferred to refill team.   1. Which medications need to be refilled? (please list name of each medication and dose if known)   rivaroxaban (XARELTO) 20 MG TABS tablet   2. Which pharmacy/location (including street and city if local pharmacy) is medication to be sent to?  [facility] [address], [place] - [address]   3. Do they need a 30 day or 90 day supply?   90 day  Patient stated he only has a little medication left.

## 2022-09-16 ENCOUNTER — Telehealth: Payer: Self-pay | Admitting: Pharmacist

## 2022-09-16 MED ORDER — RIVAROXABAN 20 MG PO TABS: 20.0000 mg | ORAL_TABLET | Freq: Every day | ORAL | 1 refills | Status: DC

## 2022-09-16 NOTE — Telephone Encounter (Signed)
Pt last saw Dr Mayford Knife 01/31/22, last labs 07/16/22 Creat 1.13, age 72, weight 87.7kg, CrCl 75.45, based on CrCl pt is on appropriate dosage of Xarelto  QD.  Will rtefill rx.

## 2022-09-16 NOTE — Telephone Encounter (Signed)
Patient called and LVM on PharmD VM. No details were given. Called pt back. He needed rx sent to [facility] for Xarelto for [employer] Rx sent.

## 2022-11-24 ENCOUNTER — Other Ambulatory Visit: Payer: Self-pay | Admitting: Cardiology

## 2023-01-19 LAB — LAB REPORT - SCANNED
Creatinine, POC: 87.8 mg/dL
Microalb Creat Ratio: 92
Microalbumin, Urine: 8.1

## 2023-01-30 ENCOUNTER — Other Ambulatory Visit: Payer: Self-pay | Admitting: Cardiology

## 2023-02-17 ENCOUNTER — Encounter: Payer: Self-pay | Admitting: Cardiology

## 2023-02-17 ENCOUNTER — Ambulatory Visit: Payer: Medicare Other | Attending: Cardiology | Admitting: Cardiology

## 2023-02-17 VITALS — BP 162/80 | HR 72 | Ht 72.0 in | Wt 199.4 lb

## 2023-02-17 DIAGNOSIS — I25119 Atherosclerotic heart disease of native coronary artery with unspecified angina pectoris: Secondary | ICD-10-CM | POA: Diagnosis not present

## 2023-02-17 DIAGNOSIS — I480 Paroxysmal atrial fibrillation: Secondary | ICD-10-CM | POA: Diagnosis not present

## 2023-02-17 DIAGNOSIS — I714 Abdominal aortic aneurysm, without rupture, unspecified: Secondary | ICD-10-CM | POA: Diagnosis present

## 2023-02-17 DIAGNOSIS — I10 Essential (primary) hypertension: Secondary | ICD-10-CM | POA: Diagnosis present

## 2023-02-17 DIAGNOSIS — R9431 Abnormal electrocardiogram [ECG] [EKG]: Secondary | ICD-10-CM | POA: Diagnosis not present

## 2023-02-17 DIAGNOSIS — E7800 Pure hypercholesterolemia, unspecified: Secondary | ICD-10-CM

## 2023-02-17 NOTE — Progress Notes (Signed)
Cardiology Office Note:    Date:  02/17/2023   ID:  Luis Adams, DOB [birth year], MRN [id number]  PCP:  Kathlee Nations, MD  Cardiologist:  Armanda Magic, MD    Referring MD: Kathlee Nations, MD   Chief Complaint  Patient presents with   Coronary Artery Disease   Hypertension   Hyperlipidemia   Atrial Fibrillation    History of Present Illness:    Luis Adams is a 72 y.o. male with a hx of paroxysmal atrial fibrillation, AAA (details unclear, followed by PCP), essential HTN, DM, RBBB, habitual alcohol.  2D echo 07/22/18 showed EF 55-60%, mildly enlarged RV.  Nuc 07/22/18 was normal, EF 53%. He developed some dizziness with metoprolol (with normal HR and BP per report) so was switched to diltiazem.   Coronary CTA showing significant ASCAD and cath 05/2020 showed prox RCA to Mid RCA lesion is 45% stenosed with 80% stenosed side branch in RV Branch, Mid RCA lesion 85% stenosed, Dist LAD lesion 40% stenosed with 80% stenosed side branch.  He underwent extensive OCT guided orbital atherectomy with DES/PCI of ostial mRCA with overlapping DES x3.   He is here today for followup and is doing well.  He denies any chest pain or pressure, SOB, DOE (except when working out in the extreme heat), PND, orthopnea, LE edema, dizziness, palpitations or syncope. He is compliant with his meds and is tolerating meds with no SE.    Past Medical History:  Diagnosis Date   AAA (abdominal aortic aneurysm) (HCC)    Arthritis    Benign essential HTN 07/13/2018   Habitual alcohol use    Hyperlipidemia    PAF (paroxysmal atrial fibrillation) (HCC) 07/13/2018   Prediabetes    RBBB     Past Surgical History:  Procedure Laterality Date   CENTRAL LINE INSERTION  07/12/2020   Procedure: CENTRAL LINE INSERTION;  Surgeon: Marykay Lex, MD;  Location: [facility] INVASIVE CV LAB;  Service: Cardiovascular;;   CORONARY ATHERECTOMY N/A 07/12/2020   Procedure: CORONARY ATHERECTOMY;  Surgeon: Marykay Lex, MD;  Location: [facility]  INVASIVE CV LAB;  Service: Cardiovascular;  Laterality: N/A;   CORONARY IMAGING/OCT N/A 07/12/2020   Procedure: INTRAVASCULAR IMAGING/OCT;  Surgeon: Marykay Lex, MD;  Location: [facility] INVASIVE CV LAB;  Service: Cardiovascular;  Laterality: N/A;   CORONARY ULTRASOUND/IVUS N/A 06/23/2020   Procedure: Intravascular Ultrasound/IVUS;  Surgeon: Marykay Lex, MD;  Location: [facility] INVASIVE CV LAB;  Service: Cardiovascular;  Laterality: N/A;   LEFT HEART CATH AND CORONARY ANGIOGRAPHY N/A 06/23/2020   Procedure: LEFT HEART CATH AND CORONARY ANGIOGRAPHY;  Surgeon: Marykay Lex, MD;  Location: [facility] INVASIVE CV LAB;  Service: Cardiovascular;  Laterality: N/A;   TONSILLECTOMY      Current Medications: Current Meds  Medication Sig   amLODipine (NORVASC) 5 MG tablet Take 1.5 tablets (7.5 mg total) by mouth daily.   aspirin EC 81 MG tablet Take 1 tablet (81 mg total) by mouth daily. Swallow whole.   atorvastatin (LIPITOR) 20 MG tablet Take 1 tablet by mouth once daily   carvedilol (COREG) 6.25 MG tablet Take 1 tablet by mouth twice daily   cyanocobalamin 1000 MCG tablet Take by mouth.   famotidine (PEPCID) 20 MG tablet Take by mouth.   lisinopril (ZESTRIL) 40 MG tablet Take 40 mg by mouth daily.   nitroGLYCERIN (NITROSTAT) 0.4 MG SL tablet PLACE 1 TABLET UNDER THE TONGUE EVERY 5 MINUTES AS NEEDED FOR CHEST PAIN   pantoprazole (PROTONIX) 20 MG tablet Take 20  mg by mouth daily.   rivaroxaban (XARELTO) 20 MG TABS tablet Take 1 tablet (20 mg total) by mouth daily with supper.   sildenafil (REVATIO) 20 MG tablet Take 100 mg by mouth daily as needed (erectile dysfunction).    Tetrahydrozoline HCl (VISINE OP) Place 1 drop into both eyes 3 (three) times daily as needed (irritation/ dry eyes).     Allergies:   Patient has no known allergies.   Social History   Socioeconomic History   Marital status: Married    Spouse name: Not on file   Number of children: Not on file   Years of education: Not on file    Highest education level: Not on file  Occupational History   Not on file  Tobacco Use   Smoking status: Never   Smokeless tobacco: Never  Vaping Use   Vaping status: Never Used  Substance and Sexual Activity   Alcohol use: Yes    Alcohol/week: 6.0 standard drinks of alcohol    Types: 6 Cans of beer per week    Comment: reports drinking 1 can of beer daily   Drug use: Never   Sexual activity: Not on file  Other Topics Concern   Not on file  Social History Narrative   Not on file   Social Determinants of Health   Financial Resource Strain: Not on file  Food Insecurity: Not on file  Transportation Needs: Not on file  Physical Activity: Not on file  Stress: Not on file  Social Connections: Unknown (09/25/2021)   Received from [employer]   Social Network    Social Network: Not on file     Family History: The patient's family history includes Cancer in his maternal grandmother. He was adopted.  ROS:   Please see the history of present illness.    ROS  All other systems reviewed and negative.   EKGs/Labs/Other Studies Reviewed:     12 Lead EKG reviewed from today demonstrating NSR with RBBB  The following studies were reviewed today:  Coronary CTA  03/2020 Aorta: Normal size. Scattered calcifications in the descending aorta. No dissection.   Aortic Valve:  Trileaflet.  No calcifications.   Coronary Arteries:  Normal coronary origin.  Right dominance.   RCA is a large dominant artery that gives rise to PDA and PLVB. There is mild calcified plaque in the proximal RCA with associated stenosis of 25-49% followed by moderate calcified plaque in the proximal RCA with associated stenosis of 50-69% but could be > 70%. There is mild calcified plaque in the mid RCA with associated stenosis of 25-49% and moderate calcified plaque in the distal RCA with associated stenosis of 50-69% but this may be overestimated due to significant blooming artifact as well  as motion artifact.   Left main is a very short artery that gives rise to LAD and LCX arteries. There is no plaque.   LAD is a large vessel. There is moderate calcified plaque in the proximal and mid LAD with associated stenosis of 50-69% but may be overestimated due to blooming artifact. There is at least mild calcified plaque in the D1 but may be greater but motion and blooming artifact precludes accurate assessment.   LCX is a non-dominant artery that gives rise to one large OM1 branch. There is mild calcified plaque in the proximal LCx with associated stenosis of 25-49%. The mid and distal LCx are not adequately visualized.   Other findings:   Normal pulmonary vein drainage into the  left atrium.   Normal let atrial appendage without a thrombus.   Normal size of the pulmonary artery.   IMPRESSION: 1. Coronary calcium score of 2413. This was 96th percentile for age and sex matched control.   2.  Normal coronary origin with right dominance.   3.  Moderate atherosclerosis.  CAD RADs 3.   4. There is significant blooming artifact and the mid and distal LCx are not well visualized.   5. Consider symptom-guided anti-ischemic and preventive pharmacotherapy as well as risk factor modification per guideline-directed care.   6.  This study has been submitted for FFR flow analysis.   Cardiac Cath 06/23/2020 Conclusion    The left ventricular systolic function is normal. LV end diastolic pressure is normal. The left ventricular ejection fraction is 55-65% by visual estimate. There is no aortic valve stenosis. Prox RCA to Mid RCA lesion is 45% stenosed with 80% stenosed side branch in RV Branch. Mid RCA lesion is 85% stenosed. Dist LAD lesion is 40% stenosed with 80% stenosed side branch in 2nd Sept.   Severe heavily calcified proximal to mid RCA with extensive 40 to 60% calcified stenosis followed by a very focal area of 90% stenosis at a bend point. ->  IVUS performed of  the proximal segment of the calcified area indicates eccentric heavily calcified plaque.  Unable to advance the catheter be to the major lesion. ->  Lesion will require atherectomy. Mild proximal LCx disease prior to giving off OM1, mild diffuse LAD disease with the most notable lesion being in the ostium of a major septal trunk/tandem LAD from the mid LAD (roughly 80%).  The follow-on LAD however gives off a diagonal branch and is relatively free of disease. Poor quality LV gram, but appears to have normal wall motion/EF and normal EDP.     RECOMMENDATIONS Patient be discharged today after prolonged bedrest because of additional heparin given for IVUS. He will be scheduled for orbital atherectomy based PCI of the RCA on Friday, 06/30/2021. He will restart Xarelto tonight at 8 PM He will loaded with Plavix (clopidogrel) 300 mg (4 tabs) on Wednesday, 06/28/2021, and then continue 75 mg twice daily   Coronary Intervention 07/12/2020 Conclusion    Target Lesion Site #1 prox RCA to Mid RCA lesion is 60% stenosed with 80% stenosed side branch in RV Branch. Mid RCA-1 lesion is 85% stenosed. Orbital atherectomy and predilation performed throughout this segment, 4 passes through the distal, 3 to the more proximal A drug-eluting stent was successfully placed using a STENT RESOLUTE ONYX 4.0X34. Postdilated to 4.3 mm Post intervention, there is a 0% residual stenosis. Ost RCA to Prox RCA lesion is 65% stenosed.-With OCT this appeared to be a disrupted area of plaque, potential guide catheter dissection. Decision was made to stent to cover it. A drug-eluting stent was successfully placed overlapping the initial stent to approximately and to the ostium of the RCA, using a STENT RESOLUTE ONYX 4.5X12. Postdilated to 4.7 mm including the overlapping segment. Post intervention, there is a 0% residual stenosis. Mid RCA-2 lesion is 85% stenosed. Likely distal edge tear from the nose cone of the atherectomy catheter.  Covered with another stent. A drug-eluting stent was successfully placed overlapping distally to cover the lesion, using a STENT RESOLUTE ONYX 3.0X15. Postdilated and taper fashion from 4.3 to 3.3 mm. Post intervention, there is a 0% residual stenosis.   SUMMARY Successful extensive OCT guided orbital atherectomy with DES PCI of the ostial to mid RCA using 3  overlapping DES stents =>  Resolute Onyx DES 4.5 mm x 12 mm ostial (postdilated to 4.8 mm),  Resolute Onyx DES 4.0 mm x 34 mm) covering the major atherectomized segment (postdilated to 4.35 mm) =>  Overlapping distal with a Resolute Onyx DES 3.0 Mm 15 Mm (post dilation at the overlap to 4.3 mm.) RFV central venous line placed, but temp wire not required after the use of Aminophylline     RECOMMENDATIONS Restart Xarelto this evening at 8:00 PM Okay to do same-day discharge Aggressive risk factor modification, atorvastatin had not yet been started, I wrote for to start.  However, would have low threshold to be more aggressive in follow-up    Recent Labs: 03/18/2022: ALT 15   Recent Lipid Panel    Physical Exam:    VS:  BP (!) 162/80   Pulse 72   Ht 6' (1.829 m)   Wt 199 lb 6.4 oz (90.4 kg)   SpO2 98%   BMI 27.04 kg/m     Wt Readings from Last 3 Encounters:  02/17/23 199 lb 6.4 oz (90.4 kg)  01/31/22 193 lb 6.4 oz (87.7 kg)  08/15/21 191 lb 12.8 oz (87 kg)    GEN: Well nourished, well developed in no acute distress HEENT: Normal NECK: No JVD; No carotid bruits LYMPHATICS: No lymphadenopathy CARDIAC:RRR, no murmurs, rubs, gallops RESPIRATORY:  Clear to auscultation without rales, wheezing or rhonchi  ABDOMEN: Soft, non-tender, non-distended MUSCULOSKELETAL:  No edema; No deformity  SKIN: Warm and dry NEUROLOGIC:  Alert and oriented x 3 PSYCHIATRIC:  Normal affect  ASSESSMENT:    1. Paroxysmal atrial fibrillation (HCC)   2. Benign essential HTN   3. Abdominal aortic aneurysm (AAA) without rupture, unspecified  part (HCC)   4. Pure hypercholesterolemia   5. Coronary artery disease involving native coronary artery of native heart with angina pectoris (HCC)    PLAN:    In order of problems listed above:  PAF -He remains in normal sinus rhythm and denies any palpitations -No bleeding problems on DOAC -Continue prescription drug management with carvedilol 6.25 mg twice daily, Xarelto 20 mg daily with as needed refills -I have personally reviewed and interpreted outside labs performed by patient's PCP which showed Hbg 13.4, SCr 1.14, K+ 4.8 on 01/19/2023  HTN -BP elevated today but normal at home from 126/55mmHg >>he ate salty dinner last night -Continue prescription drug management with amlodipine 5 mg daily, carvedilol 6.25 mg twice daily, lisinopril 40 mg daily with as needed refills -I have asked him to check his BP twice daily for a week and call with results -stressed the importance of low Na diet   AAA -followed by PCP  HLD -LDL goal < 50 with DM, CAD and AAA -I have personally reviewed and interpreted outside labs performed by patient's PCP which showed LDL 62 and HDL 48, ALT 26 on 01/19/2023 -Continue prescription drug management with atorvastatin 20 mg daily with as needed refills  ASCAD -coronary CTA showed at least moderate obstructive CAD -cath 06/2020 showed Prox RCA to Mid RCA lesion is 45% stenosed with 80% stenosed side branch in RV Branch, Mid RCA lesion is 85% stenosed, Dist LAD lesion is 40% stenosed with 80% stenosed side branch s/p extensive OCT guided orbital atherectomy with DES/PCI of ostial mRCA with overlapping DES x3.  -He has not any anginal symptoms since I saw him last -Continue prescription drug management with aspirin 81 mg daily, amlodipine 7.5 mg daily, carvedilol 6.25 mg twice daily, atorvastatin 20  mg daily with as needed refills  Folowup with me in 1 year    Medication Adjustments/Labs and Tests Ordered: Current medicines are reviewed at length with the  patient today.  Concerns regarding medicines are outlined above.  Orders Placed This Encounter  Procedures   EKG 12-Lead   No orders of the defined types were placed in this encounter.   Signed, Armanda Magic, MD  02/17/2023 1:19 PM    [facility]

## 2023-02-17 NOTE — Patient Instructions (Signed)
Medication Instructions:   Your physician recommends that you continue on your current medications as directed. Please refer to the Current Medication list given to you today.  *If you need a refill on your cardiac medications before your next appointment, please call your pharmacy*     Follow-Up: At [facility], you and your health needs are our priority.  As part of our continuing mission to provide you with exceptional heart care, we have created designated Provider Care Teams.  These Care Teams include your primary Cardiologist (physician) and Advanced Practice Providers (APPs -  Physician Assistants and Nurse Practitioners) who all work together to provide you with the care you need, when you need it.  We recommend signing up for the patient portal called "MyChart".  Sign up information is provided on this After Visit Summary.  MyChart is used to connect with patients for Virtual Visits (Telemedicine).  Patients are able to view lab/test results, encounter notes, upcoming appointments, etc.  Non-urgent messages can be sent to your provider as well.   To learn more about what you can do with MyChart, go to [email or web address].    Your next appointment:   1 year(s)  Provider:   Armanda Magic, MD

## 2023-02-21 ENCOUNTER — Other Ambulatory Visit: Payer: Self-pay | Admitting: Cardiology

## 2023-03-02 ENCOUNTER — Other Ambulatory Visit: Payer: Self-pay | Admitting: Cardiology

## 2023-03-03 ENCOUNTER — Encounter: Payer: Self-pay | Admitting: Cardiology

## 2023-03-03 NOTE — Telephone Encounter (Signed)
Prescription refill request for Xarelto received.  Indication: PAF Last office visit: 02/17/23  Kym Groom MD Weight: 90.4kg Age: 72 Scr: 1.14 on 01/17/23 CrCl: 75.99  Based on above findings Xarelto 20mg  daily is the appropriate dose.  Refill approved.

## 2023-05-01 ENCOUNTER — Other Ambulatory Visit: Payer: Self-pay | Admitting: Cardiology

## 2023-07-10 ENCOUNTER — Other Ambulatory Visit: Payer: Self-pay | Admitting: Cardiology

## 2023-09-10 ENCOUNTER — Other Ambulatory Visit (HOSPITAL_COMMUNITY): Payer: Self-pay

## 2023-09-10 ENCOUNTER — Telehealth: Payer: Self-pay | Admitting: Cardiology

## 2023-09-10 ENCOUNTER — Telehealth: Payer: Self-pay | Admitting: Pharmacy Technician

## 2023-09-10 NOTE — Telephone Encounter (Signed)
 Pt c/o medication issue:  1. Name of Medication:   XARELTO 20 MG TABS tablet    2. How are you currently taking this medication (dosage and times per day)?  TAKE 1 TABLET BY MOUTH EVERY DAY WITH SUPPER     3. Are you having a reaction (difficulty breathing--STAT)? No  4. What is your medication issue? Pt is requesting a callback regarding his pharmacy he'd been using for years no longer supplying this medication or the financial help for it. Pt would like to be advised and given recommendations on where he can get it moving forward and with financial help. Please advise

## 2023-09-10 NOTE — Telephone Encounter (Signed)
 PAP: Patient assistance application for Xarelto through [employer] has been mailed to pt's home address on file. Provider portion of application will be faxed to provider's office.

## 2023-09-17 ENCOUNTER — Other Ambulatory Visit: Payer: Self-pay | Admitting: Cardiology

## 2023-09-17 NOTE — Telephone Encounter (Signed)
 Pt last saw Dr Micael Adas 02/17/23, last labs 07/25/23 Creat 1.05, age 73, weight 90.4kg, CrCl 82.51, based on CrCl pt is on appropriate dosage of Xarelto  20mg  every day for afib.  Will refill rx.

## 2023-09-25 NOTE — Telephone Encounter (Signed)
 Reached out for 2nd attempt to check on patients portion of application.

## 2024-01-27 ENCOUNTER — Other Ambulatory Visit: Payer: Self-pay | Admitting: Cardiology

## 2024-02-16 ENCOUNTER — Other Ambulatory Visit: Payer: Self-pay | Admitting: Cardiology

## 2024-03-12 ENCOUNTER — Other Ambulatory Visit: Payer: Self-pay | Admitting: Cardiology

## 2024-03-12 NOTE — Telephone Encounter (Signed)
 Prescription refill request for Xarelto  received.  Indication:afib Last office visit:upcoming Weight:90.4  kg Age:73 Scr:1.16  9/25 CrCl:73.6  ml/min  Prescription refilled

## 2024-04-12 ENCOUNTER — Ambulatory Visit: Attending: Cardiology | Admitting: Cardiology

## 2024-04-12 ENCOUNTER — Encounter: Payer: Self-pay | Admitting: Cardiology

## 2024-04-12 ENCOUNTER — Telehealth: Payer: Self-pay | Admitting: *Deleted

## 2024-04-12 VITALS — BP 146/80 | HR 52 | Ht 72.0 in | Wt 197.0 lb

## 2024-04-12 DIAGNOSIS — I480 Paroxysmal atrial fibrillation: Secondary | ICD-10-CM | POA: Diagnosis not present

## 2024-04-12 DIAGNOSIS — E7800 Pure hypercholesterolemia, unspecified: Secondary | ICD-10-CM | POA: Diagnosis not present

## 2024-04-12 DIAGNOSIS — R9431 Abnormal electrocardiogram [ECG] [EKG]: Secondary | ICD-10-CM | POA: Insufficient documentation

## 2024-04-12 DIAGNOSIS — I714 Abdominal aortic aneurysm, without rupture, unspecified: Secondary | ICD-10-CM | POA: Insufficient documentation

## 2024-04-12 DIAGNOSIS — I10 Essential (primary) hypertension: Secondary | ICD-10-CM | POA: Diagnosis present

## 2024-04-12 DIAGNOSIS — I25119 Atherosclerotic heart disease of native coronary artery with unspecified angina pectoris: Secondary | ICD-10-CM | POA: Diagnosis present

## 2024-04-12 MED ORDER — EZETIMIBE 10 MG PO TABS: 10.0000 mg | ORAL_TABLET | Freq: Every day | ORAL | 3 refills | Status: AC

## 2024-04-12 NOTE — Patient Instructions (Signed)
 Medication Instructions:  - START ezetimibe (Zetia) 1 tablet (10 mg) daily.  *If you need a refill on your cardiac medications before your next appointment, please call your pharmacy*  Lab Work: - Fasting lipid panel and ALT in 8 weeks (around 06/07/24)  If you have labs (blood work) drawn today and your tests are completely normal, you will receive your results only by: MyChart Message (if you have MyChart) OR A paper copy in the mail If you have any lab test that is abnormal or we need to change your treatment, we will call you to review the results.  Follow-Up: At [facility], you and your health needs are our priority.  As part of our continuing mission to provide you with exceptional heart care, our providers are all part of one team.  This team includes your primary Cardiologist (physician) and Advanced Practice Providers or APPs (Physician Assistants and Nurse Practitioners) who all work together to provide you with the care you need, when you need it.  Your next appointment:   1 year(s)  Provider:   Lynwood Schilling, MD    We recommend signing up for the patient portal called MyChart.  Sign up information is provided on this After Visit Summary.  MyChart is used to connect with patients for Virtual Visits (Telemedicine).  Patients are able to view lab/test results, encounter notes, upcoming appointments, etc.  Non-urgent messages can be sent to your provider as well.   To learn more about what you can do with MyChart, go to [email or web address].

## 2024-04-12 NOTE — Telephone Encounter (Signed)
 Per Dr. Shlomo, patient will need a fasting lipid panel and ALT in 8 weeks. LMOVM for Dr. Emilee nurse at [facility] to inquire about ordering labs through their office at pt's request.

## 2024-04-12 NOTE — Progress Notes (Signed)
 Cardiology Office Note:    Date:  04/12/2024   ID:  Vasili Fok, DOB 01-07-1951, MRN [id number]  PCP:  Graydon Mt, MD  Cardiologist:  Wilbert Bihari, MD    Referring MD: Graydon Mt, MD   Chief Complaint  Patient presents with   Coronary Artery Disease   Hypertension   Atrial Fibrillation   Hyperlipidemia    History of Present Illness:    Luis Adams is a 73 y.o. male with a hx of paroxysmal atrial fibrillation, AAA (details unclear, followed by PCP), essential HTN, DM, RBBB, habitual alcohol.  2D echo 07/22/18 showed EF 55-60%, mildly enlarged RV.  Nuc 07/22/18 was normal, EF 53%. He developed some dizziness with metoprolol  (with normal HR and BP per report) so was switched to diltiazem .   Coronary CTA showing significant ASCAD and cath 05/2020 showed prox RCA to Mid RCA lesion is 45% stenosed with 80% stenosed side branch in RV Branch, Mid RCA lesion 85% stenosed, Dist LAD lesion 40% stenosed with 80% stenosed side branch.  He underwent extensive OCT guided orbital atherectomy with DES/PCI of ostial mRCA with overlapping DES x3.   He is here today for follow-up and doing well.  He denies any chest pain or pressure, SOB, DOE, PND, orthopnea, lower extremity edema, dizziness, palpitations or syncope.  Past Medical History:  Diagnosis Date   AAA (abdominal aortic aneurysm)    Arthritis    Benign essential HTN 07/13/2018   Habitual alcohol use    Hyperlipidemia    PAF (paroxysmal atrial fibrillation) (HCC) 07/13/2018   Prediabetes    RBBB     Past Surgical History:  Procedure Laterality Date   CENTRAL LINE INSERTION  07/12/2020   Procedure: CENTRAL LINE INSERTION;  Surgeon: Anner Alm ORN, MD;  Location: [facility] INVASIVE CV LAB;  Service: Cardiovascular;;   CORONARY ATHERECTOMY N/A 07/12/2020   Procedure: CORONARY ATHERECTOMY;  Surgeon: Anner Alm ORN, MD;  Location: [facility] INVASIVE CV LAB;  Service: Cardiovascular;  Laterality: N/A;   CORONARY IMAGING/OCT N/A 07/12/2020    Procedure: INTRAVASCULAR IMAGING/OCT;  Surgeon: Anner Alm ORN, MD;  Location: [facility] INVASIVE CV LAB;  Service: Cardiovascular;  Laterality: N/A;   CORONARY ULTRASOUND/IVUS N/A 06/23/2020   Procedure: Intravascular Ultrasound/IVUS;  Surgeon: Anner Alm ORN, MD;  Location: [facility] INVASIVE CV LAB;  Service: Cardiovascular;  Laterality: N/A;   LEFT HEART CATH AND CORONARY ANGIOGRAPHY N/A 06/23/2020   Procedure: LEFT HEART CATH AND CORONARY ANGIOGRAPHY;  Surgeon: Anner Alm ORN, MD;  Location: [facility] INVASIVE CV LAB;  Service: Cardiovascular;  Laterality: N/A;   TONSILLECTOMY      Current Medications: Current Meds  Medication Sig   amLODipine  (NORVASC ) 5 MG tablet Take 1.5 tablets (7.5 mg total) by mouth daily.   aspirin  EC 81 MG tablet Take 1 tablet (81 mg total) by mouth daily. Swallow whole.   atorvastatin  (LIPITOR) 20 MG tablet Take 1 tablet by mouth once daily   carvedilol  (COREG ) 6.25 MG tablet Take 1 tablet by mouth twice daily   cyanocobalamin 1000 MCG tablet Take by mouth.   famotidine (PEPCID) 20 MG tablet Take by mouth.   lisinopril (ZESTRIL) 40 MG tablet Take 40 mg by mouth daily.   nitroGLYCERIN  (NITROSTAT ) 0.4 MG SL tablet PLACE 1 TABLET UNDER THE TONGUE EVERY 5 MINUTES AS NEEDED FOR CHEST PAIN   pantoprazole (PROTONIX) 20 MG tablet Take 20 mg by mouth daily.   predniSONE (DELTASONE) 5 MG tablet Take 5 mg by mouth daily.   rivaroxaban  (XARELTO ) 20 MG TABS  tablet TAKE 1 TABLET BY MOUTH ONCE DAILY WITH SUPPER   sildenafil (REVATIO) 20 MG tablet Take 100 mg by mouth daily as needed (erectile dysfunction).    tadalafil  (CIALIS ) 10 MG tablet Take 1 tablet (10 mg total) by mouth daily as needed for erectile dysfunction.   Tetrahydrozoline HCl (VISINE OP) Place 1 drop into both eyes 3 (three) times daily as needed (irritation/ dry eyes).     Allergies:   Patient has no known allergies.   Social History   Socioeconomic History   Marital status: Married    Spouse name: Not on file    Number of children: Not on file   Years of education: Not on file   Highest education level: Not on file  Occupational History   Not on file  Tobacco Use   Smoking status: Never   Smokeless tobacco: Never  Vaping Use   Vaping status: Never Used  Substance and Sexual Activity   Alcohol use: Yes    Alcohol/week: 6.0 standard drinks of alcohol    Types: 6 Cans of beer per week    Comment: reports drinking 1 can of beer daily   Drug use: Never   Sexual activity: Not on file  Other Topics Concern   Not on file  Social History Narrative   Not on file   Social Drivers of Health   Financial Resource Strain: Not on file  Food Insecurity: Not on file  Transportation Needs: Not on file  Physical Activity: Not on file  Stress: Not on file  Social Connections: Not on file     Family History: The patient's family history includes Cancer in his maternal grandmother. He was adopted.  ROS:   Please see the history of present illness.    ROS  All other systems reviewed and negative.   EKGs/Labs/Other Studies Reviewed:     EKG Interpretation Date/Time:  Monday April 12 2024 08:32:12 EST Ventricular Rate:  52 PR Interval:  134 QRS Duration:  144 QT Interval:  454 QTC Calculation: 422 R Axis:   65  Text Interpretation: Sinus bradycardia Right bundle branch block When compared with ECG of 17-Feb-2023 13:03, T wave inversion no longer evident in Anterolateral leads Confirmed by Shlomo Corning ([id number]) on 04/12/2024 8:57:13 AM    The following studies were reviewed today:  Coronary CTA  03/2020 Aorta: Normal size. Scattered calcifications in the descending aorta. No dissection.   Aortic Valve:  Trileaflet.  No calcifications.   Coronary Arteries:  Normal coronary origin.  Right dominance.   RCA is a large dominant artery that gives rise to PDA and PLVB. There is mild calcified plaque in the proximal RCA with associated stenosis of 25-49% followed by moderate calcified  plaque in the proximal RCA with associated stenosis of 50-69% but could be > 70%. There is mild calcified plaque in the mid RCA with associated stenosis of 25-49% and moderate calcified plaque in the distal RCA with associated stenosis of 50-69% but this may be overestimated due to significant blooming artifact as well as motion artifact.   Left main is a very short artery that gives rise to LAD and LCX arteries. There is no plaque.   LAD is a large vessel. There is moderate calcified plaque in the proximal and mid LAD with associated stenosis of 50-69% but may be overestimated due to blooming artifact. There is at least mild calcified plaque in the D1 but may be greater but motion and blooming artifact precludes accurate assessment.  LCX is a non-dominant artery that gives rise to one large OM1 branch. There is mild calcified plaque in the proximal LCx with associated stenosis of 25-49%. The mid and distal LCx are not adequately visualized.   Other findings:   Normal pulmonary vein drainage into the left atrium.   Normal let atrial appendage without a thrombus.   Normal size of the pulmonary artery.   IMPRESSION: 1. Coronary calcium  score of 2413. This was 96th percentile for age and sex matched control.   2.  Normal coronary origin with right dominance.   3.  Moderate atherosclerosis.  CAD RADs 3.   4. There is significant blooming artifact and the mid and distal LCx are not well visualized.   5. Consider symptom-guided anti-ischemic and preventive pharmacotherapy as well as risk factor modification per guideline-directed care.   6.  This study has been submitted for FFR flow analysis.   Cardiac Cath 06/23/2020 Conclusion    The left ventricular systolic function is normal. LV end diastolic pressure is normal. The left ventricular ejection fraction is 55-65% by visual estimate. There is no aortic valve stenosis. Prox RCA to Mid RCA lesion is 45% stenosed with  80% stenosed side branch in RV Branch. Mid RCA lesion is 85% stenosed. Dist LAD lesion is 40% stenosed with 80% stenosed side branch in 2nd Sept.   Severe heavily calcified proximal to mid RCA with extensive 40 to 60% calcified stenosis followed by a very focal area of 90% stenosis at a bend point. ->  IVUS performed of the proximal segment of the calcified area indicates eccentric heavily calcified plaque.  Unable to advance the catheter be to the major lesion. ->  Lesion will require atherectomy. Mild proximal LCx disease prior to giving off OM1, mild diffuse LAD disease with the most notable lesion being in the ostium of a major septal trunk/tandem LAD from the mid LAD (roughly 80%).  The follow-on LAD however gives off a diagonal branch and is relatively free of disease. Poor quality LV gram, but appears to have normal wall motion/EF and normal EDP.     RECOMMENDATIONS Patient be discharged today after prolonged bedrest because of additional heparin  given for IVUS. He will be scheduled for orbital atherectomy based PCI of the RCA on Friday, 06/30/2021. He will restart Xarelto  tonight at 8 PM He will loaded with Plavix  (clopidogrel ) 300 mg (4 tabs) on Wednesday, 06/28/2021, and then continue 75 mg twice daily   Coronary Intervention 07/12/2020 Conclusion    Target Lesion Site #1 prox RCA to Mid RCA lesion is 60% stenosed with 80% stenosed side branch in RV Branch. Mid RCA-1 lesion is 85% stenosed. Orbital atherectomy and predilation performed throughout this segment, 4 passes through the distal, 3 to the more proximal A drug-eluting stent was successfully placed using a STENT RESOLUTE ONYX 4.0X34. Postdilated to 4.3 mm Post intervention, there is a 0% residual stenosis. Ost RCA to Prox RCA lesion is 65% stenosed.-With OCT this appeared to be a disrupted area of plaque, potential guide catheter dissection. Decision was made to stent to cover it. A drug-eluting stent was successfully placed  overlapping the initial stent to approximately and to the ostium of the RCA, using a STENT RESOLUTE ONYX 4.5X12. Postdilated to 4.7 mm including the overlapping segment. Post intervention, there is a 0% residual stenosis. Mid RCA-2 lesion is 85% stenosed. Likely distal edge tear from the nose cone of the atherectomy catheter. Covered with another stent. A drug-eluting stent was successfully placed overlapping  distally to cover the lesion, using a STENT RESOLUTE ONYX 3.0X15. Postdilated and taper fashion from 4.3 to 3.3 mm. Post intervention, there is a 0% residual stenosis.   SUMMARY Successful extensive OCT guided orbital atherectomy with DES PCI of the ostial to mid RCA using 3 overlapping DES stents =>  Resolute Onyx DES 4.5 mm x 12 mm ostial (postdilated to 4.8 mm),  Resolute Onyx DES 4.0 mm x 34 mm) covering the major atherectomized segment (postdilated to 4.35 mm) =>  Overlapping distal with a Resolute Onyx DES 3.0 Mm 15 Mm (post dilation at the overlap to 4.3 mm.) RFV central venous line placed, but temp wire not required after the use of Aminophylline      RECOMMENDATIONS Restart Xarelto  this evening at 8:00 PM Okay to do same-day discharge Aggressive risk factor modification, atorvastatin  had not yet been started, I wrote for to start.  However, would have low threshold to be more aggressive in follow-up    Recent Labs: No results found for requested labs within last 365 days.   Recent Lipid Panel    Physical Exam:    VS:  BP (!) 146/80 (BP Location: Left Arm, Patient Position: Sitting, Cuff Size: Normal)   Pulse (!) 52   Ht 6' (1.829 m)   Wt 197 lb (89.4 kg)   SpO2 98%   BMI 26.72 kg/m     Wt Readings from Last 3 Encounters:  04/12/24 197 lb (89.4 kg)  02/17/23 199 lb 6.4 oz (90.4 kg)  01/31/22 193 lb 6.4 oz (87.7 kg)    GEN: Well nourished, well developed in no acute distress HEENT: Normal NECK: No JVD; No carotid bruits LYMPHATICS: No  lymphadenopathy CARDIAC:RRR, no murmurs, rubs, gallops RESPIRATORY:  Clear to auscultation without rales, wheezing or rhonchi  ABDOMEN: Soft, non-tender, non-distended MUSCULOSKELETAL:  No edema; No deformity  SKIN: Warm and dry NEUROLOGIC:  Alert and oriented x 3 PSYCHIATRIC:  Normal affect  ASSESSMENT:    1. Paroxysmal atrial fibrillation (HCC)   2. Benign essential HTN   3. Abdominal aortic aneurysm (AAA) without rupture, unspecified part (HCC)   4. Pure hypercholesterolemia   5. Coronary artery disease involving native coronary artery of native heart with angina pectoris (HCC)    PLAN:    In order of problems listed above:  PAF -He continues to maintain normal sinus rhythm and denies any palpitations -Denies any bleeding problems with DOAC -Continue Xarelto  20 mg daily and carvedilol  6.25 mg daily with  as needed refills -I have personally reviewed and interpreted outside labs performed by patient's PCP which showed SCr 1.16, K+ 4.4, Hbg 13.8 on 01/30/2024  HTN -BP controlled on exam today -Continue amlodipine  7.5 mg daily, carvedilol  6.25 mg twice daily, lisinopril 40 mg daily with as needed refills  AAA -followed by PCP  HLD -LDL goal < 55 with DM, CAD and AAA -I have personally reviewed and interpreted outside labs performed by patient's PCP which showed LDL 65, HDL 68 and ALT 26 on 01/2024 -atorvastatin  20 mg daily with as needed refills>>he is adamant that he does not want to increase his statin -start Zetia 10mg  daily -repeat FLP and ALT in 8 weeks  ASCAD -coronary CTA showed at least moderate obstructive CAD -cath 06/2020 showed Prox RCA to Mid RCA lesion is 45% stenosed with 80% stenosed side branch in RV Branch, Mid RCA lesion is 85% stenosed, Dist LAD lesion is 40% stenosed with 80% stenosed side branch s/p extensive OCT guided orbital atherectomy with DES/PCI  of ostial mRCA with overlapping DES x3.  -He has not had any anginal symptoms in the past year -Continue  aspirin  81 mg daily, atorvastatin  20 mg daily, carvedilol  6.25 mg twice daily, amlodipine  5 mg daily with as needed refills  He lives near [place] and would like to go to the [place] office so he dose not have to drive the long distance to [place] so I will set him up with Dr. Lavona  in [place] in 1 year   Medication Adjustments/Labs and Tests Ordered: Current medicines are reviewed at length with the patient today.  Concerns regarding medicines are outlined above.  Orders Placed This Encounter  Procedures   EKG 12-Lead   No orders of the defined types were placed in this encounter.   Signed, Wilbert Bihari, MD  04/12/2024 8:40 AM    [facility]

## 2024-04-12 NOTE — Addendum Note (Signed)
 Addended by: Pinkie Manger C on: 04/12/2024 09:11 AM   Modules accepted: Orders

## 2024-04-27 ENCOUNTER — Other Ambulatory Visit: Payer: Self-pay | Admitting: Cardiology

## 2024-04-30 ENCOUNTER — Encounter: Payer: Self-pay | Admitting: Cardiology

## 2024-04-30 ENCOUNTER — Other Ambulatory Visit: Payer: Self-pay

## 2024-04-30 MED ORDER — ATORVASTATIN CALCIUM 20 MG PO TABS: 20.0000 mg | ORAL_TABLET | Freq: Every day | ORAL | 3 refills | Status: AC

## 2024-05-17 NOTE — Telephone Encounter (Signed)
 LMOVM for Dr. Emilee nurse. Direct number provided.

## 2024-05-26 NOTE — Telephone Encounter (Signed)
 Luis Adams from Dr. Emilee office returned call. She states that patient can just bring [facility] order slips to their office to have labs drawn. He will just need to call to make a lab appointment. Pt is due for labs the week of 06/07/24.  Left detailed message at home number [facility]) advising patient of the above information. Direct number provided for questions or concerns.

## 2024-05-26 NOTE — Telephone Encounter (Signed)
 Third attempt:  LMOVM for Dr. Emilee nurse. Direct number provided.

## 2024-06-04 ENCOUNTER — Encounter: Payer: Self-pay | Admitting: Cardiology
